# Patient Record
Sex: Female | Born: 1970 | Hispanic: Yes | Marital: Single | Smoking: Never smoker
Health system: Southern US, Community
[De-identification: ages and names within clinical notes are randomized; demographics above are authoritative.]

## PROBLEM LIST (undated history)

## (undated) HISTORY — PX: CHOLECYSTECTOMY: SHX55

---

## 2004-01-01 HISTORY — PX: CHOLECYSTECTOMY: SHX55

## 2004-04-11 ENCOUNTER — Ambulatory Visit (HOSPITAL_COMMUNITY): Admission: RE | Admit: 2004-04-11 | Discharge: 2004-04-11 | Payer: Self-pay | Admitting: General Surgery

## 2004-04-27 ENCOUNTER — Inpatient Hospital Stay (HOSPITAL_COMMUNITY): Admission: RE | Admit: 2004-04-27 | Discharge: 2004-04-28 | Payer: Self-pay | Admitting: General Surgery

## 2007-04-22 ENCOUNTER — Emergency Department (HOSPITAL_COMMUNITY): Admission: EM | Admit: 2007-04-22 | Discharge: 2007-04-22 | Payer: Self-pay | Admitting: Emergency Medicine

## 2007-04-24 ENCOUNTER — Ambulatory Visit (HOSPITAL_COMMUNITY): Admission: RE | Admit: 2007-04-24 | Discharge: 2007-04-24 | Payer: Self-pay | Admitting: Emergency Medicine

## 2010-06-26 ENCOUNTER — Emergency Department (HOSPITAL_COMMUNITY): Admission: EM | Admit: 2010-06-26 | Discharge: 2010-06-26 | Payer: Self-pay | Admitting: Emergency Medicine

## 2010-06-28 ENCOUNTER — Emergency Department (HOSPITAL_COMMUNITY): Admission: EM | Admit: 2010-06-28 | Discharge: 2010-06-29 | Payer: Self-pay | Admitting: Emergency Medicine

## 2010-11-17 ENCOUNTER — Emergency Department (HOSPITAL_COMMUNITY)
Admission: EM | Admit: 2010-11-17 | Discharge: 2010-11-17 | Payer: Self-pay | Source: Home / Self Care | Admitting: Emergency Medicine

## 2011-01-15 ENCOUNTER — Other Ambulatory Visit
Admission: RE | Admit: 2011-01-15 | Discharge: 2011-01-15 | Payer: Self-pay | Source: Home / Self Care | Admitting: Obstetrics and Gynecology

## 2011-01-21 ENCOUNTER — Encounter: Payer: Self-pay | Admitting: General Surgery

## 2011-03-13 LAB — CBC
HCT: 30.9 % — ABNORMAL LOW (ref 36.0–46.0)
Hemoglobin: 10.2 g/dL — ABNORMAL LOW (ref 12.0–15.0)
MCV: 79.9 fL (ref 78.0–100.0)
RBC: 3.87 MIL/uL (ref 3.87–5.11)
WBC: 9.1 10*3/uL (ref 4.0–10.5)

## 2011-03-13 LAB — COMPREHENSIVE METABOLIC PANEL
BUN: 5 mg/dL — ABNORMAL LOW (ref 6–23)
CO2: 22 mEq/L (ref 19–32)
Calcium: 9.1 mg/dL (ref 8.4–10.5)
Chloride: 104 mEq/L (ref 96–112)
Creatinine, Ser: 0.49 mg/dL (ref 0.4–1.2)
GFR calc non Af Amer: >60 mL/min (ref >60)
Total Bilirubin: 0.3 mg/dL (ref 0.3–1.2)

## 2011-03-13 LAB — HCG, QUANTITATIVE, PREGNANCY: hCG, Beta Chain, Quant, S: 96052 m[IU]/mL — ABNORMAL HIGH (ref <5)

## 2011-03-13 LAB — DIFFERENTIAL
Basophils Relative: 0 % (ref 0–1)
Lymphs Abs: 2.8 10*3/uL (ref 0.7–4.0)
Monocytes Relative: 7 % (ref 3–12)
Neutro Abs: 5.5 10*3/uL (ref 1.7–7.7)
Neutrophils Relative %: 61 % (ref 43–77)

## 2011-03-13 LAB — URINALYSIS, ROUTINE W REFLEX MICROSCOPIC
Bilirubin Urine: NEGATIVE
Ketones, ur: 15 mg/dL — AB
Nitrite: NEGATIVE
Protein, ur: NEGATIVE mg/dL
Urobilinogen, UA: 0.2 mg/dL (ref 0.0–1.0)

## 2011-03-13 LAB — URINE MICROSCOPIC-ADD ON

## 2011-03-13 LAB — POCT PREGNANCY, URINE: Preg Test, Ur: POSITIVE

## 2011-03-18 LAB — GC/CHLAMYDIA PROBE AMP, GENITAL
Chlamydia, DNA Probe: NEGATIVE
GC Probe Amp, Genital: NEGATIVE

## 2011-03-18 LAB — WET PREP, GENITAL
Trich, Wet Prep: NONE SEEN
Yeast Wet Prep HPF POC: NONE SEEN

## 2011-05-18 NOTE — Op Note (Signed)
NAME:  Bonnie Fleming, STIEFEL                 ACCOUNT NO.:  [id number]   MEDICAL RECORD NO.:  [id number]                   PATIENT TYPE:  AMB   LOCATION:  DAY                                  FACILITY:  [facility]   PHYSICIAN:  Barbaraann Barthel, M.D.              DATE OF BIRTH:  Oct 26, 1971   DATE OF PROCEDURE:  04/26/2004  DATE OF DISCHARGE:                                 OPERATIVE REPORT   SURGEON:  Barbaraann Barthel, M.D.   PREOPERATIVE DIAGNOSIS:  Cholecystitis, cholelithiasis.   POSTOPERATIVE DIAGNOSIS:  Cholecystitis, cholelithiasis.   PROCEDURE:  Laparoscopic cholecystectomy.   SPECIMENS:  Gallbladder and gallstones.   INDICATIONS FOR PROCEDURE:  This is a 40 year old [place] female who came to  my office with a history of right upper quadrant pain and nausea for quite  some time.  Her liver function studies were within normal limits.  Her  sonogram revealed multiple gallstones.  We discussed laparoscopic  cholecystectomy with this patient in detail in Spanish, and informed consent  was obtained.  We discussed the complications also in Spanish, not limited  to but including bleeding, infection, damage to bile ducts, perforation of  organs, and transitory diarrhea.  Informed consent was obtained.   GROSS OPERATIVE FINDINGS:  Those consistent with chronically inflamed-  appearing gallbladder with some adhesions, a small cystic duct which was not  cannulated, and multiple stones within the gallbladder.  The right upper  quadrant otherwise appeared normal.   TECHNIQUE:  The patient was placed in the supine position.  After the  adequate administration of general anesthesia via endotracheal intubation,  her entire abdomen was prepped with Betadine solution and draped in the  usual manner.  Following aseptic technique, a Foley catheter was placed.   A periumbilical incision was carried out over the superior aspect of the  umbilicus.  With the patient in Trendelenburg, the fascia  was grasped with a  sharp towel clip, and a Veress needle was inserted and confirmed to be in  position with a saline drop test.  Then using the Visiport technique, an 11-  mm cannula was placed in the umbilicus.  Then under direct vision, three  other cannulas were placed, one in the epigastrium and two in the right  upper quadrant laterally.  The gallbladder was grasped.  Its adhesions were  taken down.  The cystic duct was clearly visualized, triply silver clipped  and divided as was the cystic artery.  The gallbladder was then removed  uneventfully from the hepatic bed using the hook cautery device.  We then  removed the gallbladder using the endosac device through the epigastric  incision.  There was a little bleeding from the rectus muscles.  We had to  extend incision somewhat in order to allow the large stones to be removed,  as they were difficult to crush.  We had to manipulate this somewhat to get  through the epigastric incision.  This was not a problem, however.  We then  checked for hemostasis, irrigated, and then closed the fascia incisions with  0 Polysorb in the areas where the 11-mm cannula was placed in the  epigastrium and the umbilicus and then closed all skin incisions with the  stapling device.  Prior to closure, all sponge, needle, and instrument  counts were found to be correct.  Estimated blood loss was minimal.  No  drains were placed.  There were no complications.  The patient received 1 L  of crystalloid intraoperatively.   The patient was taken to the recovery room in satisfactory condition.      ___________________________________________                                            Barbaraann Barthel, M.D.   WB/MEDQ  D:  04/26/2004  T:  04/26/2004  Job:  [id number]

## 2011-05-18 NOTE — Discharge Summary (Signed)
NAME:  Bonnie Fleming, Bonnie Fleming                 ACCOUNT NO.:  [id number]   MEDICAL RECORD NO.:  [id number]                   PATIENT TYPE:  INP   LOCATION:  A306                                 FACILITY:  [facility]   PHYSICIAN:  Barbaraann Barthel, M.D.              DATE OF BIRTH:  1971/02/26   DATE OF ADMISSION:  04/26/2004  DATE OF DISCHARGE:  04/28/2004                                 DISCHARGE SUMMARY   DIAGNOSES:  1. Cholecystitis.  2. Cholelithiasis.   PROCEDURE:  On April 26, 2004, laparoscopic cholecystectomy.   COMPLICATIONS:  Urinary retention.   HISTORY OF PRESENT ILLNESS:  This is a 40 year old [place] female who  presented to my office with signs and symptoms of gallbladder disease.  Workup had shown her to have multiple stones within the gallbladder.  Her  liver function tests and amylase were grossly within normal limits.  She was  taken to surgery via the outpatient department and she did well  postoperatively after a laparoscopic cholecystectomy was performed.  However, she developed acute urinary retention requiring straight  catheterization, and then afterwards she was still complaining of dysuria  and unable to void.  We placed a Foley overnight, and then the following day  removed that Foley and she was able to void well without any problems.  We  started her on Bactrim and obtained urine cultures which are pending at  present.   Regarding her surgery, her wounds remained clean and with minimal incisional  discomfort, and she was tolerating full liquids well without any problem,  and ambulating without shortness of breath or leg pain.   LABORATORY DATA:  She was mildly anemic preoperatively with a hemoglobin of  10.9 and a hematocrit of 34.6.  At the time of her discharge hemoglobin and  hematocrit were 9.6 and 28.9.  This H&H was done on April 12, 2004.  Her  serum hCG was negative.  Her electrolytes were grossly within normal limits,  and her bilirubin was 0.3,  amylase was 48, SGOT and SGPT were normal at 15 a  piece, alkaline phosphatase was 85.   DISCHARGE INSTRUCTIONS:  We discharged her with the following instructions:  She is excused from work.  She is to be on a soft diet and liquids.  She is  told to increase her activities as tolerated.  She is permitted to shower.  She is told no driving, no heavy lifting or no sexual activities until so  instructed.  She is told to clean her wounds with alcohol t.i.d., take  Darvocet-N 100 one tablet q.4h. for pain.  She is to avoid taking any  aspirin, and we placed her on Bactrim DS one tablet q.12h. x10 days.  She  has been instructed as to when her next dose is, and her follow up  appointment with me.  She has also been told should she have any acute  problems to come to the emergency room.    ___________________________________________  Barbaraann Barthel, M.D.   WB/MEDQ  D:  04/28/2004  T:  04/28/2004  Job:  [id number]

## 2011-06-14 ENCOUNTER — Inpatient Hospital Stay (HOSPITAL_COMMUNITY)
Admission: AD | Admit: 2011-06-14 | Discharge: 2011-06-16 | DRG: 775 | Disposition: A | Discharge status: Home / Self Care | Payer: Medicaid Other | Source: Ambulatory Visit | Attending: Obstetrics & Gynecology | Admitting: Obstetrics & Gynecology

## 2011-06-14 DIAGNOSIS — O09529 Supervision of elderly multigravida, unspecified trimester: Secondary | ICD-10-CM

## 2011-06-14 LAB — CBC
HCT: 39.9 % (ref 36.0–46.0)
Hemoglobin: 13.8 g/dL (ref 12.0–15.0)
MCH: 32.4 pg (ref 26.0–34.0)
MCHC: 34.6 g/dL (ref 30.0–36.0)
RDW: 14.4 % (ref 11.5–15.5)

## 2011-12-11 ENCOUNTER — Other Ambulatory Visit: Payer: Self-pay | Admitting: Nurse Practitioner

## 2011-12-11 ENCOUNTER — Other Ambulatory Visit (HOSPITAL_COMMUNITY)
Admission: RE | Admit: 2011-12-11 | Discharge: 2011-12-11 | Disposition: A | Discharge status: Home / Self Care | Payer: Medicaid Other | Source: Ambulatory Visit | Attending: Family Medicine | Admitting: Family Medicine

## 2011-12-11 ENCOUNTER — Other Ambulatory Visit (HOSPITAL_COMMUNITY)
Admission: RE | Admit: 2011-12-11 | Discharge: 2011-12-11 | Disposition: A | Discharge status: Home / Self Care | Payer: Self-pay | Source: Ambulatory Visit | Attending: Unknown Physician Specialty | Admitting: Unknown Physician Specialty

## 2011-12-11 DIAGNOSIS — R87611 Atypical squamous cells cannot exclude high grade squamous intraepithelial lesion on cytologic smear of cervix (ASC-H): Secondary | ICD-10-CM | POA: Insufficient documentation

## 2011-12-11 DIAGNOSIS — R87619 Unspecified abnormal cytological findings in specimens from cervix uteri: Secondary | ICD-10-CM | POA: Insufficient documentation

## 2012-06-23 ENCOUNTER — Emergency Department (HOSPITAL_COMMUNITY)
Admission: EM | Admit: 2012-06-23 | Discharge: 2012-06-23 | Disposition: A | Discharge status: Home / Self Care | Payer: Self-pay | Attending: Emergency Medicine | Admitting: Emergency Medicine

## 2012-06-23 ENCOUNTER — Encounter (HOSPITAL_COMMUNITY): Payer: Self-pay | Admitting: *Deleted

## 2012-06-23 DIAGNOSIS — X19XXXA Contact with other heat and hot substances, initial encounter: Secondary | ICD-10-CM | POA: Insufficient documentation

## 2012-06-23 DIAGNOSIS — T22212A Burn of second degree of left forearm, initial encounter: Secondary | ICD-10-CM

## 2012-06-23 DIAGNOSIS — T22219A Burn of second degree of unspecified forearm, initial encounter: Secondary | ICD-10-CM | POA: Insufficient documentation

## 2012-06-23 DIAGNOSIS — L089 Local infection of the skin and subcutaneous tissue, unspecified: Secondary | ICD-10-CM | POA: Insufficient documentation

## 2012-06-23 MED ORDER — LIDOCAINE HCL (PF) 1 % IJ SOLN
INTRAMUSCULAR | Status: AC
  Administered 2012-06-23: 18:00:00
  Filled 2012-06-23: qty 5

## 2012-06-23 MED ORDER — DOXYCYCLINE MONOHYDRATE 100 MG PO TABS: 100.0000 mg | ORAL_TABLET | Freq: Two times a day (BID) | ORAL | Status: AC

## 2012-06-23 MED ORDER — OXYCODONE-ACETAMINOPHEN 5-325 MG PO TABS
1.0000 | ORAL_TABLET | Freq: Once | ORAL | Status: AC
  Administered 2012-06-23: 1 via ORAL
  Filled 2012-06-23: qty 1

## 2012-06-23 MED ORDER — CEFTRIAXONE SODIUM 1 G IJ SOLR
1.0000 g | Freq: Once | INTRAMUSCULAR | Status: AC
  Administered 2012-06-23: 1 g via INTRAMUSCULAR
  Filled 2012-06-23: qty 10

## 2012-06-23 MED ORDER — PERCOCET 5-325 MG PO TABS: 1.0000 | ORAL_TABLET | Freq: Four times a day (QID) | ORAL | Status: AC | PRN

## 2012-06-23 MED ORDER — DOXYCYCLINE HYCLATE 100 MG PO TABS
100.0000 mg | ORAL_TABLET | Freq: Two times a day (BID) | ORAL | Status: DC
  Administered 2012-06-23: 100 mg via ORAL
  Filled 2012-06-23: qty 1

## 2012-06-23 MED ORDER — DEXTROSE 5 % IV SOLN: 1.0000 g | Freq: Once | INTRAVENOUS | Status: DC

## 2012-06-23 MED ORDER — SILVER SULFADIAZINE 1 % EX CREA
TOPICAL_CREAM | Freq: Once | CUTANEOUS | Status: AC
  Administered 2012-06-23: 18:00:00 via TOPICAL
  Filled 2012-06-23: qty 50
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
## 2012-06-24 ENCOUNTER — Encounter (HOSPITAL_COMMUNITY): Payer: Self-pay | Admitting: *Deleted

## 2012-06-24 ENCOUNTER — Emergency Department (HOSPITAL_COMMUNITY)
Admission: EM | Admit: 2012-06-24 | Discharge: 2012-06-24 | Disposition: A | Discharge status: Home / Self Care | Payer: Self-pay | Attending: Emergency Medicine | Admitting: Emergency Medicine

## 2012-06-24 DIAGNOSIS — Z09 Encounter for follow-up examination after completed treatment for conditions other than malignant neoplasm: Secondary | ICD-10-CM

## 2012-06-24 DIAGNOSIS — Z4801 Encounter for change or removal of surgical wound dressing: Secondary | ICD-10-CM | POA: Insufficient documentation

## 2012-06-24 MED ORDER — OXYCODONE-ACETAMINOPHEN 5-325 MG PO TABS: 1.0000 | ORAL_TABLET | ORAL | Status: AC | PRN

## 2012-06-24 MED ORDER — OXYCODONE-ACETAMINOPHEN 5-325 MG PO TABS
1.0000 | ORAL_TABLET | Freq: Once | ORAL | Status: AC
  Administered 2012-06-24: 1 via ORAL
  Filled 2012-06-24: qty 1

## 2012-06-24 MED ORDER — DOXYCYCLINE HYCLATE 100 MG PO TABS
100.0000 mg | ORAL_TABLET | Freq: Once | ORAL | Status: AC
  Administered 2012-06-24: 100 mg via ORAL
  Filled 2012-06-24: qty 1
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
## 2012-06-24 NOTE — ED Provider Notes (Signed)
Medical screening examination/treatment/procedure(s) were performed by non-physician practitioner and as supervising physician I was immediately available for consultation/collaboration.  Koty Anctil, MD 06/24/12 1848 

## 2012-06-24 NOTE — Discharge Instructions (Signed)
It is very important that you gently wash your wound with mild soap and water twice daily and then reapply a thin layer of Silvadene antibiotic cream you were given yesterday.  It is also very important that you start taking the doxycycline that you were prescribed yesterday, make sure you get this medication filled tomorrow morning.  You were given your evening dose here tonight.  You may use the pain medication that you were given here as needed, one or 2 tablets every 6 hours for pain relief, however this medication will make you drowsy so do not drive within 4 hours of taking it.  Call Dr. Dian Situ as discussed as he needs to follow you as this infected wound continues to heal.  Please call his office in the morning for recheck of your injury within the next 2 days.  If you are unable to see him, please return here for recheck of your wound.

## 2012-06-30 MED FILL — Oxycodone w/ Acetaminophen Tab 5-325 MG: ORAL | Qty: 6 | Status: AC

## 2014-06-16 ENCOUNTER — Other Ambulatory Visit (HOSPITAL_COMMUNITY): Payer: Self-pay | Admitting: *Deleted

## 2014-06-16 DIAGNOSIS — Z1231 Encounter for screening mammogram for malignant neoplasm of breast: Secondary | ICD-10-CM

## 2014-06-24 ENCOUNTER — Ambulatory Visit (HOSPITAL_COMMUNITY): Payer: Medicaid Other

## 2014-06-29 ENCOUNTER — Ambulatory Visit (HOSPITAL_COMMUNITY)
Admission: RE | Admit: 2014-06-29 | Discharge: 2014-06-29 | Disposition: A | Discharge status: Home / Self Care | Payer: Medicaid Other | Source: Ambulatory Visit | Attending: *Deleted | Admitting: *Deleted

## 2014-06-29 DIAGNOSIS — Z1231 Encounter for screening mammogram for malignant neoplasm of breast: Secondary | ICD-10-CM

## 2015-10-26 ENCOUNTER — Other Ambulatory Visit (HOSPITAL_COMMUNITY): Payer: Self-pay | Admitting: Nurse Practitioner

## 2015-10-26 DIAGNOSIS — Z1231 Encounter for screening mammogram for malignant neoplasm of breast: Secondary | ICD-10-CM

## 2015-11-02 ENCOUNTER — Ambulatory Visit (HOSPITAL_COMMUNITY)
Admission: RE | Admit: 2015-11-02 | Discharge: 2015-11-02 | Disposition: A | Discharge status: Home / Self Care | Payer: Medicaid Other | Source: Ambulatory Visit | Attending: Nurse Practitioner | Admitting: Nurse Practitioner

## 2015-11-02 DIAGNOSIS — Z1231 Encounter for screening mammogram for malignant neoplasm of breast: Secondary | ICD-10-CM

## 2016-05-15 ENCOUNTER — Encounter (HOSPITAL_COMMUNITY): Payer: Self-pay

## 2016-05-15 ENCOUNTER — Emergency Department (HOSPITAL_COMMUNITY)
Admission: EM | Admit: 2016-05-15 | Discharge: 2016-05-15 | Disposition: A | Discharge status: Home / Self Care | Payer: Self-pay | Attending: Emergency Medicine | Admitting: Emergency Medicine

## 2016-05-15 ENCOUNTER — Emergency Department (HOSPITAL_COMMUNITY): Payer: Self-pay

## 2016-05-15 DIAGNOSIS — R1013 Epigastric pain: Secondary | ICD-10-CM | POA: Insufficient documentation

## 2016-05-15 DIAGNOSIS — Z79899 Other long term (current) drug therapy: Secondary | ICD-10-CM | POA: Insufficient documentation

## 2016-05-15 DIAGNOSIS — Z9049 Acquired absence of other specified parts of digestive tract: Secondary | ICD-10-CM | POA: Insufficient documentation

## 2016-05-15 DIAGNOSIS — R109 Unspecified abdominal pain: Secondary | ICD-10-CM

## 2016-05-15 LAB — URINALYSIS, ROUTINE W REFLEX MICROSCOPIC
BILIRUBIN URINE: NEGATIVE
Glucose, UA: NEGATIVE mg/dL
Hgb urine dipstick: NEGATIVE
Ketones, ur: NEGATIVE mg/dL
LEUKOCYTES UA: NEGATIVE
NITRITE: NEGATIVE
PROTEIN: NEGATIVE mg/dL
SPECIFIC GRAVITY, URINE: 1.015 (ref 1.005–1.030)
pH: 6.5 (ref 5.0–8.0)

## 2016-05-15 LAB — CBC WITH DIFFERENTIAL/PLATELET
Basophils Absolute: 0 10*3/uL (ref 0.0–0.1)
Basophils Relative: 0 %
EOS ABS: 0 10*3/uL (ref 0.0–0.7)
EOS PCT: 0 %
HCT: 38.8 % (ref 36.0–46.0)
Hemoglobin: 13.3 g/dL (ref 12.0–15.0)
LYMPHS ABS: 1.6 10*3/uL (ref 0.7–4.0)
LYMPHS PCT: 14 %
MCH: 31.4 pg (ref 26.0–34.0)
MCHC: 34.3 g/dL (ref 30.0–36.0)
MCV: 91.7 fL (ref 78.0–100.0)
MONO ABS: 0.6 10*3/uL (ref 0.1–1.0)
MONOS PCT: 5 %
Neutro Abs: 8.9 10*3/uL — ABNORMAL HIGH (ref 1.7–7.7)
Neutrophils Relative %: 81 %
PLATELETS: 266 10*3/uL (ref 150–400)
RBC: 4.23 MIL/uL (ref 3.87–5.11)
RDW: 12.8 % (ref 11.5–15.5)
WBC: 11 10*3/uL — AB (ref 4.0–10.5)

## 2016-05-15 LAB — COMPREHENSIVE METABOLIC PANEL
ALT: 77 U/L — AB (ref 14–54)
ANION GAP: 7 (ref 5–15)
AST: 106 U/L — ABNORMAL HIGH (ref 15–41)
Albumin: 4.7 g/dL (ref 3.5–5.0)
Alkaline Phosphatase: 91 U/L (ref 38–126)
BUN: 15 mg/dL (ref 6–20)
CHLORIDE: 102 mmol/L (ref 101–111)
CO2: 26 mmol/L (ref 22–32)
CREATININE: 0.41 mg/dL — AB (ref 0.44–1.00)
Calcium: 9.3 mg/dL (ref 8.9–10.3)
Glucose, Bld: 112 mg/dL — ABNORMAL HIGH (ref 65–99)
POTASSIUM: 3.8 mmol/L (ref 3.5–5.1)
SODIUM: 135 mmol/L (ref 135–145)
Total Bilirubin: 0.8 mg/dL (ref 0.3–1.2)
Total Protein: 8 g/dL (ref 6.5–8.1)

## 2016-05-15 LAB — PREGNANCY, URINE: PREG TEST UR: NEGATIVE

## 2016-05-15 LAB — LIPASE, BLOOD: LIPASE: 28 U/L (ref 11–51)

## 2016-05-15 MED ORDER — IOPAMIDOL (ISOVUE-300) INJECTION 61%
100.0000 mL | Freq: Once | INTRAVENOUS | Status: AC | PRN
  Administered 2016-05-15: 100 mL via INTRAVENOUS

## 2016-05-15 MED ORDER — TRAMADOL HCL 50 MG PO TABS: 50.0000 mg | ORAL_TABLET | Freq: Four times a day (QID) | ORAL | Status: DC | PRN

## 2016-05-15 MED ORDER — PANTOPRAZOLE SODIUM 40 MG IV SOLR
40.0000 mg | Freq: Once | INTRAVENOUS | Status: AC
  Administered 2016-05-15: 40 mg via INTRAVENOUS
  Filled 2016-05-15: qty 40

## 2016-05-15 MED ORDER — ONDANSETRON HCL 4 MG/2ML IJ SOLN
4.0000 mg | Freq: Once | INTRAMUSCULAR | Status: AC
  Administered 2016-05-15: 4 mg via INTRAVENOUS
  Filled 2016-05-15: qty 2

## 2016-05-15 MED ORDER — PROMETHAZINE HCL 25 MG PO TABS: 25.0000 mg | ORAL_TABLET | Freq: Four times a day (QID) | ORAL | Status: DC | PRN

## 2016-05-15 MED ORDER — RANITIDINE HCL 150 MG PO TABS: 150.0000 mg | ORAL_TABLET | Freq: Two times a day (BID) | ORAL | Status: DC

## 2016-05-15 MED ORDER — HYDROMORPHONE HCL 1 MG/ML IJ SOLN
1.0000 mg | Freq: Once | INTRAMUSCULAR | Status: AC
  Administered 2016-05-15: 1 mg via INTRAVENOUS
  Filled 2016-05-15: qty 1

## 2016-05-15 NOTE — ED Provider Notes (Signed)
CSN: [id number]     Arrival date & time 05/15/16  1638 History   First MD Initiated Contact with Patient 05/15/16 1928     Chief Complaint  Patient presents with  . Abdominal Pain     (Consider location/radiation/quality/duration/timing/severity/associated sxs/prior Treatment) Patient is a 45 y.o. female presenting with abdominal pain. The history is provided by the patient (The patient complains of epigastric abdominal pain with some nausea).  Abdominal Pain Pain location:  Epigastric Pain quality: aching   Pain radiates to:  Does not radiate Pain severity:  Moderate Onset quality:  Sudden Timing:  Constant Progression:  Waxing and waning Chronicity:  New Associated symptoms: no chest pain, no cough, no diarrhea, no fatigue and no hematuria     History reviewed. No pertinent past medical history. Past Surgical History  Procedure Laterality Date  . Cholecystectomy     No family history on file. Social History  Substance Use Topics  . Smoking status: Never Smoker   . Smokeless tobacco: None  . Alcohol Use: No   OB History    No data available     Review of Systems  Constitutional: Negative for appetite change and fatigue.  HENT: Negative for congestion, ear discharge and sinus pressure.   Eyes: Negative for discharge.  Respiratory: Negative for cough.   Cardiovascular: Negative for chest pain.  Gastrointestinal: Positive for abdominal pain. Negative for diarrhea.  Genitourinary: Negative for frequency and hematuria.  Musculoskeletal: Negative for back pain.  Skin: Negative for rash.  Neurological: Negative for seizures and headaches.  Psychiatric/Behavioral: Negative for hallucinations.      Allergies  Review of patient's allergies indicates no known allergies.  Home Medications   Prior to Admission medications   Medication Sig Start Date End Date Taking? Authorizing Provider  levonorgestrel (MIRENA) 20 MCG/24HR IUD 1 each by Intrauterine route once.   Yes  Historical Provider, MD  Naproxen Sodium (ALEVE) 220 MG CAPS Take 440 mg by mouth as needed. For pain   Yes Historical Provider, MD  promethazine (PHENERGAN) 25 MG tablet Take 1 tablet (25 mg total) by mouth every 6 (six) hours as needed for nausea or vomiting. 05/15/16   Milton Ferguson, MD  ranitidine (ZANTAC) 150 MG tablet Take 1 tablet (150 mg total) by mouth 2 (two) times daily. 05/15/16   Milton Ferguson, MD  traMADol (ULTRAM) 50 MG tablet Take 1 tablet (50 mg total) by mouth every 6 (six) hours as needed. 05/15/16   Milton Ferguson, MD   BP 94/55 mmHg  Pulse 69  Temp(Src) 98.7 F (37.1 C) (Oral)  Resp 19  Ht 5\' 1"  (1.549 m)  Wt 140 lb (63.504 kg)  BMI 26.47 kg/m2  SpO2 100%  LMP 05/08/2016 Physical Exam  Constitutional: She is oriented to person, place, and time. She appears well-developed.  HENT:  Head: Normocephalic.  Eyes: Conjunctivae and EOM are normal. No scleral icterus.  Neck: Neck supple. No thyromegaly present.  Cardiovascular: Normal rate and regular rhythm.  Exam reveals no gallop and no friction rub.   No murmur heard. Pulmonary/Chest: No stridor. She has no wheezes. She has no rales. She exhibits no tenderness.  Abdominal: She exhibits no distension. There is tenderness. There is no rebound.  Musculoskeletal: Normal range of motion. She exhibits no edema.  Lymphadenopathy:    She has no cervical adenopathy.  Neurological: She is oriented to person, place, and time. She exhibits normal muscle tone. Coordination normal.  Skin: No rash noted. No erythema.  Psychiatric: She has  a normal mood and affect. Her behavior is normal.    ED Course  Procedures (including critical care time) Labs Review Labs Reviewed  CBC WITH DIFFERENTIAL/PLATELET - Abnormal; Notable for the following:    WBC 11.0 (*)    Neutro Abs 8.9 (*)    All other components within normal limits  COMPREHENSIVE METABOLIC PANEL - Abnormal; Notable for the following:    Glucose, Bld 112 (*)    Creatinine,  Ser 0.41 (*)    AST 106 (*)    ALT 77 (*)    All other components within normal limits  URINALYSIS, ROUTINE W REFLEX MICROSCOPIC (NOT AT [facility])  PREGNANCY, URINE  LIPASE, BLOOD    Imaging Review Ct Abdomen Pelvis W Contrast  05/15/2016  CLINICAL DATA:  Pain of umbilical region and nausea today. EXAM: CT ABDOMEN AND PELVIS WITH CONTRAST TECHNIQUE: Multidetector CT imaging of the abdomen and pelvis was performed using the standard protocol following bolus administration of intravenous contrast. CONTRAST:  180mL ISOVUE-300 IOPAMIDOL (ISOVUE-300) INJECTION 61% COMPARISON:  None. FINDINGS: Lower chest: No acute findings. Minimal dependent atelectasis of the lung bases are noted. Hepatobiliary: The liver is normal. The patient is status post prior cholecystectomy. There is postsurgical intra and extrahepatic biliary ductal dilatation. The common bile duct measures 1.2 cm Pancreas: No mass, inflammatory changes, or other significant abnormality. Spleen: Within normal limits in size and appearance. Adrenals/Urinary Tract: The adrenal glands are normal. There is a 3 mm cyst in the midpole left kidney. The right kidney is normal. There is no hydronephrosis bilaterally. The bladder is normal. Stomach/Bowel: No evidence of obstruction, inflammatory process, or abnormal fluid collections. There is no diverticulitis. The appendix is normal. Vascular/Lymphatic: No pathologically enlarged lymph nodes. No evidence of abdominal aortic aneurysm. Reproductive: IUD is identified in the uterus. There is a 5.1 x 4.9 cm cyst in the right ovary. The left ovary normal. Other: None. Musculoskeletal:  No suspicious bone lesions identified. IMPRESSION: There is no bowel obstruction or diverticulitis. The appendix is normal. 5.1 cm cyst in the right ovary, likely a follicular cyst. IUD in the uterus. Prior cholecystectomy with postsurgical dilatation of intra and extrahepatic biliary ducts. Electronically Signed   By: Abelardo Diesel  M.D.   On: 05/15/2016 20:55   I have personally reviewed and evaluated these images and lab results as part of my medical decision-making.   EKG Interpretation None      MDM   Final diagnoses:  Abdominal pain in female    Patient with epigastric pain. CT scan unremarkable. Labs do show mild elevation of liver studies. Patient has had a cholecystectomy.. Patient will be sent home with Zantac Ultram and Zofran and referred to GI    Milton Ferguson, MD 05/15/16 2218

## 2016-05-15 NOTE — Discharge Instructions (Signed)
Follow up with dr. Gala Romney in 2 weeks for recheck

## 2016-05-15 NOTE — ED Notes (Signed)
Pt c/o pain in umbillical region and nausea since noon today.  Denies any vomiting or diarrhea.  LBM was 1 hour ago and was normal per pt.

## 2016-10-31 ENCOUNTER — Other Ambulatory Visit (HOSPITAL_COMMUNITY): Payer: Self-pay | Admitting: Nurse Practitioner

## 2016-10-31 DIAGNOSIS — Z1231 Encounter for screening mammogram for malignant neoplasm of breast: Secondary | ICD-10-CM

## 2016-11-14 ENCOUNTER — Ambulatory Visit (HOSPITAL_COMMUNITY)
Admission: RE | Admit: 2016-11-14 | Discharge: 2016-11-14 | Disposition: A | Discharge status: Home / Self Care | Payer: Medicaid Other | Source: Ambulatory Visit | Attending: Nurse Practitioner | Admitting: Nurse Practitioner

## 2016-11-14 DIAGNOSIS — Z1231 Encounter for screening mammogram for malignant neoplasm of breast: Secondary | ICD-10-CM

## 2018-02-05 ENCOUNTER — Other Ambulatory Visit: Payer: Self-pay | Admitting: Nurse Practitioner

## 2018-02-11 ENCOUNTER — Other Ambulatory Visit: Payer: Self-pay | Admitting: Nurse Practitioner

## 2018-02-11 DIAGNOSIS — Z1231 Encounter for screening mammogram for malignant neoplasm of breast: Secondary | ICD-10-CM

## 2018-03-19 ENCOUNTER — Ambulatory Visit
Admission: RE | Admit: 2018-03-19 | Discharge: 2018-03-19 | Disposition: A | Discharge status: Home / Self Care | Payer: No Typology Code available for payment source | Source: Ambulatory Visit | Attending: Nurse Practitioner | Admitting: Nurse Practitioner

## 2018-03-19 DIAGNOSIS — Z1231 Encounter for screening mammogram for malignant neoplasm of breast: Secondary | ICD-10-CM

## 2020-02-04 ENCOUNTER — Ambulatory Visit: Payer: No Typology Code available for payment source | Attending: Internal Medicine

## 2020-02-04 ENCOUNTER — Other Ambulatory Visit: Payer: Self-pay

## 2020-02-04 DIAGNOSIS — Z20822 Contact with and (suspected) exposure to covid-19: Secondary | ICD-10-CM | POA: Insufficient documentation

## 2020-02-05 LAB — NOVEL CORONAVIRUS, NAA: SARS-CoV-2, NAA: NOT DETECTED

## 2020-02-09 ENCOUNTER — Telehealth: Payer: Self-pay | Admitting: General Practice

## 2020-02-09 NOTE — Telephone Encounter (Signed)
Negative COVID results given. Patient results "NOT Detected." Caller expressed understanding. ° °

## 2020-02-15 ENCOUNTER — Other Ambulatory Visit: Payer: Self-pay | Admitting: Nurse Practitioner

## 2020-02-15 DIAGNOSIS — Z30431 Encounter for routine checking of intrauterine contraceptive device: Secondary | ICD-10-CM

## 2020-02-15 DIAGNOSIS — N852 Hypertrophy of uterus: Secondary | ICD-10-CM

## 2020-02-19 ENCOUNTER — Ambulatory Visit (HOSPITAL_COMMUNITY)
Admission: RE | Admit: 2020-02-19 | Discharge: 2020-02-19 | Disposition: A | Discharge status: Home / Self Care | Payer: Self-pay | Source: Ambulatory Visit | Attending: Nurse Practitioner | Admitting: Nurse Practitioner

## 2020-02-19 ENCOUNTER — Other Ambulatory Visit: Payer: Self-pay

## 2020-02-19 DIAGNOSIS — N852 Hypertrophy of uterus: Secondary | ICD-10-CM | POA: Insufficient documentation

## 2020-02-19 DIAGNOSIS — Z30431 Encounter for routine checking of intrauterine contraceptive device: Secondary | ICD-10-CM | POA: Insufficient documentation

## 2020-03-02 ENCOUNTER — Other Ambulatory Visit (HOSPITAL_COMMUNITY): Payer: Self-pay | Admitting: Nurse Practitioner

## 2020-03-02 DIAGNOSIS — N83201 Unspecified ovarian cyst, right side: Secondary | ICD-10-CM

## 2020-04-08 ENCOUNTER — Ambulatory Visit (HOSPITAL_COMMUNITY)
Admission: RE | Admit: 2020-04-08 | Discharge: 2020-04-08 | Disposition: A | Discharge status: Home / Self Care | Payer: Self-pay | Source: Ambulatory Visit | Attending: Nurse Practitioner | Admitting: Nurse Practitioner

## 2020-04-08 ENCOUNTER — Other Ambulatory Visit: Payer: Self-pay

## 2020-04-08 DIAGNOSIS — N83201 Unspecified ovarian cyst, right side: Secondary | ICD-10-CM | POA: Insufficient documentation

## 2020-04-22 ENCOUNTER — Other Ambulatory Visit: Payer: Self-pay

## 2020-04-22 ENCOUNTER — Ambulatory Visit (INDEPENDENT_AMBULATORY_CARE_PROVIDER_SITE_OTHER): Payer: Self-pay | Admitting: Obstetrics & Gynecology

## 2020-04-22 ENCOUNTER — Encounter: Payer: Self-pay | Admitting: Obstetrics & Gynecology

## 2020-04-22 VITALS — BP 111/73 | HR 70 | Ht 60.0 in | Wt 140.0 lb

## 2020-04-22 DIAGNOSIS — N80129 Deep endometriosis of ovary, unspecified ovary: Secondary | ICD-10-CM

## 2020-04-22 DIAGNOSIS — N801 Endometriosis of ovary: Secondary | ICD-10-CM

## 2020-04-22 NOTE — Progress Notes (Signed)
Follow up appointment for results  Chief Complaint  Patient presents with  . Ovarian Cyst    Blood pressure 111/73, pulse 70, height 5' (1.524 m), weight 140 lb (63.5 kg).  Study Result  CLINICAL DATA:  RIGHT ovarian cyst  EXAM: TRANSABDOMINAL AND TRANSVAGINAL ULTRASOUND OF PELVIS  TECHNIQUE: Both transabdominal and transvaginal ultrasound examinations of the pelvis were performed. Transabdominal technique was performed for global imaging of the pelvis including uterus, ovaries, adnexal regions, and pelvic cul-de-sac. It was necessary to proceed with endovaginal exam following the transabdominal exam to visualize the endometrium and LEFT ovary, and to characterize a RIGHT adnexal cystic lesion.  COMPARISON:  02/19/2020  FINDINGS: Uterus  Measurements: 8.3 x 5.8 x 6.4 cm = volume: 160 mL. Retroverted and mildly retroflexed. Diffusely inhomogeneous myometrial echogenicity with ill-defined endometrial complex, raising question of adenomyosis. Small hypoechoic foci are identified subserosal, could represent more heterogeneous areas of adenomyosis or small leiomyomata each measuring up to 17 mm diameter.  Endometrium  Thickness: 8 mm.  No endometrial fluid or focal abnormality  Right ovary  Measurements: 9.1 x 7.9 x 8.4 cm = volume: 316 mL. Predominantly replaced by a large cyst 7.7 x 7.9 x 7.9 cm (volume = 250 cm^3) previously 8.6 x 6.2 x 8.5 cm (volume = 240 cm^3), containing scattered low level internal echogenicity. No septations or mural nodularity. No internal vascularity on color Doppler imaging.  Left ovary  Measurements: 2.7 x 1.4 x 1.5 cm = volume: 2.8 mL. Normal morphology without mass  Other findings  No free pelvic fluid or additional adnexal masses.  IMPRESSION: Suspect adenomyosis of the uterus, cannot exclude additional 2 small subserosal leiomyomata.  Large mildly complicated cyst of the RIGHT ovary versus endometrioma 7.7 x  7.9 x 7.9 cm, little changed in volume since the previous exam.  This lesion has been present since CT ex reflex she [facility] EM chills bike am from 05/15/2016, then 5.4 cm maximal size.  As this has been generally stable over 6 weeks, either continued follow-up by ultrasound in 3-6 months to demonstrate stability or characterization by MR recommended.       MEDS ordered this encounter: No orders of the defined types were placed in this encounter.   Orders for this encounter: No orders of the defined types were placed in this encounter.   Impression:   ICD-10-CM   1. 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       Face to face time:  20 minutes  Greater than 50% of the visit time was spent in counseling and coordination of care with the patient.  The summary and outline of the counseling and care coordination is summarized in the note above.   All questions were answered.  History reviewed. No pertinent past medical history.  Past Surgical History:  Procedure Laterality Date  . CHOLECYSTECTOMY      OB History   No obstetric history on file.     No Known Allergies  Social History   Socioeconomic History  . Marital status: Divorced    Spouse name: Not on file  . Number of children: Not on file  . Years of education: Not on file  . Highest education level: Not on file  Occupational History  . Not on file  Tobacco Use  . Smoking status: Never Smoker  .  Smokeless tobacco: Never Used  Substance and Sexual Activity  . Alcohol use: No  . Drug use: No  . Sexual activity: Yes    Birth control/protection: Injection  Other Topics Concern  . Not on file  Social History Narrative  . Not on file   Social Determinants of Health   Financial Resource Strain: Medium Risk  . Difficulty of Paying Living Expenses: Somewhat  hard  Food Insecurity: No Food Insecurity  . Worried About Charity fundraiser in the Last Year: Never true  . Ran Out of Food in the Last Year: Never true  Transportation Needs: No Transportation Needs  . Lack of Transportation (Medical): No  . Lack of Transportation (Non-Medical): No  Physical Activity: Insufficiently Active  . Days of Exercise per Week: 1 day  . Minutes of Exercise per Session: 30 min  Stress: No Stress Concern Present  . Feeling of Stress : Not at all  Social Connections: Slightly Isolated  . Frequency of Communication with Friends and Family: Three times a week  . Frequency of Social Gatherings with Friends and Family: Never  . Attends Religious Services: More than 4 times per year  . Active Member of Clubs or Organizations: No  . Attends Archivist Meetings: Never  . Marital Status: Living with partner    Family History  Problem Relation Age of Onset  . Diabetes Father

## 2020-05-04 ENCOUNTER — Other Ambulatory Visit (HOSPITAL_COMMUNITY): Payer: Self-pay | Admitting: Nurse Practitioner

## 2020-05-04 DIAGNOSIS — Z1231 Encounter for screening mammogram for malignant neoplasm of breast: Secondary | ICD-10-CM

## 2020-05-11 ENCOUNTER — Other Ambulatory Visit: Payer: Self-pay | Admitting: Obstetrics & Gynecology

## 2020-05-11 NOTE — Patient Instructions (Signed)
Instrucciones Para Antes de la Ciruga   Su ciruga est programada para- 05/18/2020 @ 1000.   Bonnie Fleming    Por favor llame al [phone] si tiene algn problema en la maana de la ciruga.                   Recuerde   No coma alimentos ni tome lquidos, incluyendo agua, despus de la medianoche del 05/17/2020.    Tome estas medicinas en la maana de la ciruga con un SORBITO de agua - None   Puede cepillarse los dientes en la maana de la [place].    No use joyas, maquillaje de ojos, lpiz labial, crema para el cuerpo o esmalte de uas oscuro.    No puede usar desodorante.    Si va a ser ingresado despues de la ciruga, deje la [employer] en el carro hasta que se le haya asignado una habitacin.   A los pacientes que se les d de alta el mismo da no se les permitir manejar a casa.     Use ropa suelta y cmoda de regreso a casa.     Salpingooforectoma unilateral, cuidados posteriores Unilateral Salpingo-Oophorectomy, Care After Lea esta informacin sobre cmo cuidarse despus del procedimiento. Su mdico tambin podr darle indicaciones ms especficas. Comunquese con su mdico si tiene problemas o preguntas. Qu puedo esperar despus del procedimiento? Despus del procedimiento, es comn [employer] siguientes sntomas:  Dolor abdominal.  Algn sangrado vaginal ocasional (pequeas manchas).  Cansancio. Siga estas indicaciones en su casa: Cuidados de la incisin   Mantenga la zona de la incisin y el vendaje limpios y secos.  Siga las indicaciones del mdico acerca del cuidado de la incisin. Haga lo siguiente: ? Lvese las manos con agua y [place] antes de cambiar el vendaje. Use desinfectante para manos si no dispone de [place] y [place]. ? Cambie el vendaje como se lo haya indicado el mdico. ? No retire los puntos (suturas), las grapas, la goma para cerrar la piel o las tiras [place]. Es posible que  estos cierres cutneos Animal nutritionist en la piel durante 2semanas o ms tiempo. Si los bordes de las tiras adhesivas empiezan a despegarse y Therapist, sports, puede recortar los que estn sueltos. No retire las tiras [facility] por completo a menos que el mdico se lo indique.  [place] zona de la incisin para detectar signos de infeccin. Est atento a los siguientes signos: ? Dolor, hinchazn o enrojecimiento. ? Lquido o sangre. ? Calor. ? Pus o mal olor. Actividad  No conduzca ni use maquinaria pesada mientras toma analgsicos recetados.  No conduzca durante 24horas si le administraron un medicamento para ayudarlo a que se relaje (sedante).  Haga caminatas cortas y frecuentes Agricultural consultant. Descanse cuando se sienta cansada. Pregntele al mdico qu actividades son seguras para usted.  Evite actividades que requieran un gran esfuerzo. Tambin, evite levantar pesos [employer]. No levante ningn objeto que pese ms de 5libras (2,3kg) o el lmite de peso que le indique su mdico hasta que este le diga que puede [place].  No se haga duchas vaginales, no use tampones ni tenga relaciones sexuales hasta que su mdico lo autorice. Instrucciones generales  A fin de prevenir o tratar el estreimiento mientras toma analgsicos recetados, el mdico puede recomendarle lo siguiente: ? Beber suficiente lquido para mantener la orina de color amarillo plido. ? Tomar medicamentos recetados o de [employer]. ? Consumir alimentos ricos en fibra, como frutas y verduras frescas, cereales integrales y frijoles. ? Limitar el consumo de alimentos ricos en grasas y azcares procesados, como alimentos fritos o dulces.  Tome los medicamentos de venta libre y los recetados solamente como se lo haya indicado el mdico.  No tome baos de inmersin, no nade ni use el jacuzzi hasta que el mdico lo autorice. Pregntele al mdico si puede ducharse. [name] solo le permitan darse baos de  [place].  Use las medias de compresin como se lo haya indicado el mdico. Estas medias ayudan a Mining engineer formacin de cogulos de [name] y a reducir la hinchazn de las piernas.  Concurra a todas las visitas de control como se lo haya indicado el mdico. Esto es importante. Comunquese con un mdico si:  Siente dolor al [employer].  Tiene una secrecin con feo olor o pus que proviene de la vagina.  Tiene enrojecimiento, hinchazn o dolor alrededor de la incisin.  Le sale lquido o sangre de la incisin.  La incisin est caliente al tacto.  Tiene pus o percibe que sale mal olor del lugar de la incisin.  Tiene fiebre.  La incisin comienza a abrirse.  Tiene un dolor abdominal que empeora o que no mejora con los medicamentos.  Le aparece una erupcin cutnea.  Presenta nuseas o vmitos.  Se siente mareado. Solicite ayuda de inmediato si:  Tree surgeon en el pecho o en la pierna.  Le falta el aire.  Se desmaya.  Observa un aumento de la hemorragia vaginal. Resumen  Despus del procedimiento, es comn Patent attorney, cansancio y sangrado ocasional de la vagina.  Siga las indicaciones del mdico acerca del cuidado de la incisin.  Controle la incisin todos los das para detectar signos de infeccin e informe sobre cualquier sntoma a su mdico.  Siga las indicaciones del mdico respecto de las actividades y Futures trader. Esta informacin no tiene Marine scientist el consejo del mdico. Asegrese de hacerle al mdico cualquier pregunta que tenga. Document Revised: 07/23/2017 Document Reviewed: 07/23/2017 [name] Patient Education  [place] general en adultos, cuidados posteriores General Anesthesia, Adult, Care After Lea esta informacin sobre cmo cuidarse despus del procedimiento. El mdico tambin podr darle instrucciones ms especficas. Comunquese con su mdico si tiene problemas o preguntas. Qu puedo esperar despus del  procedimiento? Luego del procedimiento, son comunes los siguiente efectos secundarios:  Dolor o Scientist, research (life sciences) en el lugar de la va intravenosa (i.v.).  Nuseas.  Vmitos.  Dolor de Investment banker, operational.  Dificultad para concentrarse.  Sentir fro o [employer].  Debilidad o cansancio.  Somnolencia y Programmer, applications.  Malestar y Hydrologist. Estos efectos secundarios pueden afectar partes del cuerpo que no estuvieron involucradas en la ciruga. Siga estas indicaciones en su casa:  Durante al menos 24horas despus del procedimiento:  Pdale a un adulto responsable que permanezca con usted. Es importante que alguien cuide de usted hasta que se despierte y Cabin crew.  Descanse todo lo que sea necesario.  No haga lo siguiente: ? Participar en actividades en las que podra caerse o lastimarse. ? Conducir. ? Operar maquinarias pesadas. ? Beber alcohol. ? Tomar somnferos o medicamentos que causen somnolencia. ? Firmar documentos legales ni tomar [employer]. ? Cuidar a nios  por su cuenta. Qu debe comer y beber  Siga las indicaciones del mdico respecto de las restricciones de comidas o bebidas.  Cuando [employer], comience a comer cantidades pequeas de alimentos que sean blandos y fciles de Publishing copy (livianos), como una tostada. Retome su dieta habitual de forma gradual.  Beba suficiente lquido como para mantener la orina de color amarillo plido.  Si vomita, rehidrtese tomando agua, jugo o caldo transparente. Instrucciones generales  Si tiene apnea del sueo, la [place] y ciertos medicamentos pueden aumentar el riesgo de problemas respiratorios. Siga las indicaciones del mdico respecto al uso de su dispositivo para dormir: ? Siempre que duerma, incluso durante las siestas que tome en el da. ? Mientras tome analgsicos recetados, medicamentos para dormir o medicamentos que producen somnolencia.  Reanude sus actividades normales segn lo indicado por el mdico.  Pregntele al mdico qu actividades son seguras para usted.  Tome los medicamentos de venta libre y los recetados solamente como se lo haya indicado el mdico.  Si fuma, no lo haga sin supervisin.  Concurra a todas las visitas de seguimiento como se lo haya indicado el mdico. Esto es importante. Comunquese con un mdico si:  Tiene nuseas o vmitos que no mejoran con medicamentos.  No puede comer ni beber sin vomitar.  El dolor no se alivia con medicamentos.  No puede orinar.  Tiene una erupcin cutnea.  Tiene fiebre.  Presenta enrojecimiento alrededor del lugar de la va intravenosa (i.v.) que empeora. Solicite ayuda de inmediato si:  Tiene dificultad para respirar.  Siente dolor en el pecho.  Observa sangre en la orina o heces, o vomita sangre. Resumen  Despus del procedimiento, es comn tener dolor de garganta y nuseas. Tambin es comn sentirse cansado.  Pdale a un adulto responsable que permanezca con usted durante 24 horas despus de la anestesia general. Es importante que alguien cuide de usted hasta que se despierte y Cabin crew.  Cuando [employer], comience a comer cantidades pequeas de alimentos que sean blandos y fciles de Publishing copy (livianos), como una tostada. Retome su dieta habitual de forma gradual.  Beba suficiente lquido como para mantener la orina de color amarillo plido.  Reanude sus actividades normales segn lo indicado por el mdico. Pregntele al mdico qu actividades son seguras para usted. Esta informacin no tiene Marine scientist el consejo del mdico. Asegrese de hacerle al mdico cualquier pregunta que tenga. Document Revised: 10/14/2017 Document Reviewed: 10/14/2017 [name] Patient Education  [place] usar clorhexidina para baarse How to Use Chlorhexidine for Bathing El gluconato de clorhexidina (CHG) es una solucin desinfectante (antisptica) que se utiliza para limpiar la piel. Puede eliminar las  bacterias que normalmente viven en la piel y mantenerlas alejadas durante aproximadamente 24 horas. Para limpiarse la piel con CHG, es posible que le den lo siguiente:  Una solucin de CHG para usar en la ducha o como parte de un bao de [place].  Un pao preenvasado que contenga CHG. Limpiar la piel con CHG puede ayudar a disminuir el riesgo de infeccin:  Mientras permanece en la unidad de cuidados intensivos del hospital.  Si tiene un acceso vascular, como una va central, para proporcionar acceso a corto o largo plazo a las venas.  Si tiene un catter para que drene orina de la vejiga.  Si tiene Animal nutritionist. El respirador es un aparato que lo ayuda a Ambulance person al hacer que el aire entre y salga de sus pulmones.  Despus de [place]. Cules son  los riesgos? Los riesgos de usar de CHG incluyen los siguientes:  Reacciones en la piel.  Prdida de la audicin si el CHG ingresa en los odos.  Lesin ocular si el CHG ingresa en los ojos y no se enjuaga.  El CHG es inflamable. Asegrese de no fumar ni acercarse al fuego luego de aplicarse CHG en la piel. No use CHG:  Si es alrgico a la clorhexidina o anteriormente tuvo una reaccin a la clorhexidina.  En bebs de menos de 2 meses. Cmo usar la solucin de CHG  Use CHG nicamente como se lo indique su mdico y siga las instrucciones de la etiqueta.  Use la cantidad de CHG que le indicaron. Usualmente, la medida es una botella. Durante una ducha Siga estos pasos al usar la solucin de CHG durante una ducha (a menos que su mdico le brinde instrucciones diferentes): 1. Comience a ducharse. 2. Lvese la cara y el cabello con el jabn y el champ que utiliza habitualmente. [place] de abajo del agua. 4. Vierta el CHG en un pao limpio. No use ningn cepillo o esponja con bordes rugosos. 5. Comience en el cuello y colquese la espuma en todo el cuerpo [employer]. Asegrese de seguir estas  instrucciones: ? Si le harn una ciruga, preste especial atencin a la parte del cuerpo Research officer, political party. Frote la zona durante al menos 1 minuto. ? No use el CHG en su cabeza o cara. Si la solucin [employer] odos o los ojos, enjuague bien con [place]. ? Evite la zona genital. ? Evite las zonas de la piel que estn lastimadas o tengan cortes o raspaduras. ? Frote su espalda y [employer]. Asegrese de lavar los pliegues de la piel. 6. Deje actuar la espuma por 1 o 2 minutos, o por el tiempo que le haya indicado el mdico. 7. Enjuguese todo el cuerpo bajo la ducha. Asegrese de enjuagar bien todos los pliegues y hendiduras de la piel. 8. Seque con una toalla limpia. Despus no aplique ninguna sustancia en el cuerpo, como polvo, locin o perfume, excepto que se lo indique el mdico. Solo use las lociones recomendadas por el fabricante. 9. Pngase pijama o ropa limpia. 10. Si es la noche anterior a la ciruga, duerma en sbanas limpias.  Durante un bao de esponja Siga estos pasos al usar la solucin de CHG durante un bao de [place] (a menos que su mdico le brinde instrucciones diferentes): 1. Lvese la cara y el cabello con el jabn y el champ que utiliza habitualmente. 2. Vierta el CHG en un pao limpio. 3. Comience en el cuello y colquese la espuma en todo el cuerpo [employer]. Asegrese de seguir estas instrucciones: ? Si le harn una ciruga, preste especial atencin a la parte del cuerpo Research officer, political party. Frote la zona durante al menos 1 minuto. ? No use el CHG en su cabeza o cara. Si la solucin [employer] odos o los ojos, enjuague bien con [place]. ? Evite la zona genital. ? Evite las zonas de la piel que estn lastimadas o tengan cortes o raspaduras. ? Frote su espalda y [employer]. Asegrese de lavar los pliegues de la piel. 4. Deje actuar la espuma por 1 o 2 minutos, o por el tiempo que le haya indicado el mdico. 5. Con un pao  limpio y hmedo diferente, enjuguese bien todo el cuerpo. Asegrese de Acupuncturist  todos los pliegues y hendiduras de la piel. 6. Seque con una toalla limpia. Despus no aplique ninguna sustancia en el cuerpo, como polvo, locin o perfume, excepto que se lo indique el mdico. Solo use las lociones recomendadas por el fabricante. 7. Pngase pijama o ropa limpia. 8. Si es la noche anterior a la ciruga, duerma en sbanas limpias. [employer] paos preenvasados de CHG  Use los paos de CHG nicamente como se lo haya indicado el mdico y siga las instrucciones de la etiqueta.  Use los paos de CHG sobre la piel limpia y [place].  No use el pao de CHG en la cabeza o el rostro a menos que el mdico se lo indique.  Cuando lave con el pao de CHG: ? Evite la zona genital. ? Evite las zonas de la piel que estn lastimadas o tengan cortes o raspaduras. Antes de la ciruga Siga estos pasos al usar un pao de CHG para limpiar antes de una ciruga (a menos que su mdico le brinde instrucciones diferentes): 1. Con el pao de CHG, frtese con firmeza la parte del cuerpo donde le realizarn la ciruga. Frtese de un lado al otro durante 3 minutos. El rea del cuerpo debe estar completamente hmeda con CHG cuando termine de frotar. 2. No enjuague. Deseche el pao y deje que el rea se seque al aire. Despus no aplique ninguna sustancia sobre el rea, como polvos, lociones o perfume. 3. Pngase pijama o ropa limpia. 4. Si es la noche anterior a la ciruga, duerma en sbanas limpias.  Para baarse en general Siga estos pasos al usar el pao de CHG para baarse en general (a menos que su mdico le brinde instrucciones diferentes). 1. Use un pao de CHG diferente en cada rea del cuerpo. Asegrese de [employer] de la piel y [place] dedos de las manos y de los pies. Lvese el cuerpo en el siguiente orden, usando un pao nuevo despus de cada paso: ? La parte delantera del cuello, los hombros  y [place]. ? Ambos brazos, debajo de los brazos y [place]. ? El estmago y la zona de la ingle, evitando los genitales. ? La pierna y el pie derechos. ? La pierna y el pie izquierdos. ? La parte posterior del cuello, la espalda y las nalgas. 2. No enjuague. Deseche el pao y deje que el rea se seque al aire. Despus no aplique ninguna sustancia en el cuerpo, como polvo, locin o perfume, excepto que se lo indique el mdico. Solo use las lociones recomendadas por el fabricante. 3. Pngase pijama o ropa limpia. Comunquese con un mdico si:  La piel se irrita despus de frotarla.  Tiene preguntas sobre cmo usar la solucin o el pao. Solicite ayuda inmediatamente si:  Los ojos se ponen muy rojos o Cytogeneticist.  Siente picazn intensa en los ojos.  Siente picazn intensa en la piel y est roja o hinchada.  Nota cambios en su audicin.  Tiene dificultad para ver.  Tiene hinchazn u hormigueo en la garganta o la boca.  Tiene dificultad para respirar.  Traga un poco de clorhexidina. Resumen  El gluconato de clorhexidina (CHG) es una solucin desinfectante (antisptica) que se utiliza para limpiar la piel. Limpiar la piel con CHG puede ayudar a disminuir el riesgo de infeccin.  Es posible que le indiquen que utilice CHG para baarse. Esta solucin puede venir en botella o en un pao preenvasado para que use sobre su piel. Siga  cuidadosamente las instrucciones del mdico y las instrucciones que se encuentran en la etiqueta del producto.  No use CHG si es alrgico a la clorhexidina.  Pngase en contacto con su mdico si se le irrita la piel luego de frotar. Esta informacin no tiene Marine scientist el consejo del mdico. Asegrese de hacerle al mdico cualquier pregunta que tenga. Document Revised: 04/07/2019 Document Reviewed: 01/18/2018 [name] Patient Education  [place].

## 2020-05-16 ENCOUNTER — Other Ambulatory Visit (HOSPITAL_COMMUNITY)
Admission: RE | Admit: 2020-05-16 | Discharge: 2020-05-16 | Disposition: A | Discharge status: Home / Self Care | Payer: Self-pay | Source: Ambulatory Visit | Attending: Obstetrics & Gynecology | Admitting: Obstetrics & Gynecology

## 2020-05-16 ENCOUNTER — Encounter (HOSPITAL_COMMUNITY)
Admission: RE | Admit: 2020-05-16 | Discharge: 2020-05-16 | Disposition: A | Discharge status: Home / Self Care | Payer: Self-pay | Source: Ambulatory Visit | Attending: Obstetrics & Gynecology | Admitting: Obstetrics & Gynecology

## 2020-05-16 ENCOUNTER — Encounter (HOSPITAL_COMMUNITY): Payer: Self-pay

## 2020-05-16 ENCOUNTER — Other Ambulatory Visit: Payer: Self-pay

## 2020-05-16 DIAGNOSIS — Z01812 Encounter for preprocedural laboratory examination: Secondary | ICD-10-CM | POA: Insufficient documentation

## 2020-05-16 DIAGNOSIS — Z20822 Contact with and (suspected) exposure to covid-19: Secondary | ICD-10-CM | POA: Insufficient documentation

## 2020-05-16 LAB — COMPREHENSIVE METABOLIC PANEL
ALT: 120 U/L — ABNORMAL HIGH (ref 0–44)
AST: 74 U/L — ABNORMAL HIGH (ref 15–41)
Albumin: 4.1 g/dL (ref 3.5–5.0)
Alkaline Phosphatase: 249 U/L — ABNORMAL HIGH (ref 38–126)
Anion gap: 8 (ref 5–15)
BUN: 12 mg/dL (ref 6–20)
CO2: 21 mmol/L — ABNORMAL LOW (ref 22–32)
Calcium: 8.7 mg/dL — ABNORMAL LOW (ref 8.9–10.3)
Chloride: 106 mmol/L (ref 98–111)
Creatinine, Ser: 0.36 mg/dL — ABNORMAL LOW (ref 0.44–1.00)
GFR calc Af Amer: >60 mL/min (ref >60)
GFR calc non Af Amer: >60 mL/min (ref >60)
Glucose, Bld: 92 mg/dL (ref 70–99)
Potassium: 3.7 mmol/L (ref 3.5–5.1)
Sodium: 135 mmol/L (ref 135–145)
Total Bilirubin: 0.7 mg/dL (ref 0.3–1.2)
Total Protein: 7.4 g/dL (ref 6.5–8.1)

## 2020-05-16 LAB — URINALYSIS, ROUTINE W REFLEX MICROSCOPIC
Bilirubin Urine: NEGATIVE
Glucose, UA: NEGATIVE mg/dL
Ketones, ur: NEGATIVE mg/dL
Nitrite: NEGATIVE
Protein, ur: NEGATIVE mg/dL
Specific Gravity, Urine: 1.009 (ref 1.005–1.030)
pH: 6 (ref 5.0–8.0)

## 2020-05-16 LAB — TYPE AND SCREEN
ABO/RH(D): O POS
Antibody Screen: NEGATIVE

## 2020-05-16 LAB — HCG, QUANTITATIVE, PREGNANCY: hCG, Beta Chain, Quant, S: <1 m[IU]/mL (ref <5)

## 2020-05-16 LAB — CBC
HCT: 40 % (ref 36.0–46.0)
Hemoglobin: 13.3 g/dL (ref 12.0–15.0)
MCH: 31.3 pg (ref 26.0–34.0)
MCHC: 33.3 g/dL (ref 30.0–36.0)
MCV: 94.1 fL (ref 80.0–100.0)
Platelets: 282 10*3/uL (ref 150–400)
RBC: 4.25 MIL/uL (ref 3.87–5.11)
RDW: 12.4 % (ref 11.5–15.5)
WBC: 8.3 10*3/uL (ref 4.0–10.5)
nRBC: 0 % (ref 0.0–0.2)

## 2020-05-16 LAB — ABO/RH: ABO/RH(D): O POS

## 2020-05-16 LAB — RAPID HIV SCREEN (HIV 1/2 AB+AG)
HIV 1/2 Antibodies: NONREACTIVE
HIV-1 P24 Antigen - HIV24: NONREACTIVE

## 2020-05-17 LAB — SARS CORONAVIRUS 2 (TAT 6-24 HRS): SARS Coronavirus 2: NEGATIVE

## 2020-05-18 ENCOUNTER — Ambulatory Visit (HOSPITAL_COMMUNITY): Payer: Self-pay | Admitting: Anesthesiology

## 2020-05-18 ENCOUNTER — Encounter (HOSPITAL_COMMUNITY)
Admission: RE | Disposition: A | Discharge status: Home / Self Care | Payer: Self-pay | Source: Home / Self Care | Attending: Obstetrics & Gynecology

## 2020-05-18 ENCOUNTER — Encounter (HOSPITAL_COMMUNITY): Payer: Self-pay | Admitting: Obstetrics & Gynecology

## 2020-05-18 ENCOUNTER — Ambulatory Visit (HOSPITAL_COMMUNITY)
Admission: RE | Admit: 2020-05-18 | Discharge: 2020-05-18 | Disposition: A | Discharge status: Home / Self Care | Payer: Self-pay | Attending: Obstetrics & Gynecology | Admitting: Obstetrics & Gynecology

## 2020-05-18 ENCOUNTER — Other Ambulatory Visit: Payer: Self-pay

## 2020-05-18 DIAGNOSIS — D270 Benign neoplasm of right ovary: Secondary | ICD-10-CM | POA: Insufficient documentation

## 2020-05-18 DIAGNOSIS — N83511 Torsion of right ovary and ovarian pedicle: Secondary | ICD-10-CM | POA: Insufficient documentation

## 2020-05-18 DIAGNOSIS — N8353 Torsion of ovary, ovarian pedicle and fallopian tube: Secondary | ICD-10-CM

## 2020-05-18 HISTORY — PX: LAPAROSCOPIC UNILATERAL SALPINGO OOPHERECTOMY: SHX5935

## 2020-05-18 LAB — TYPE AND SCREEN
ABO/RH(D): O POS
Antibody Screen: NEGATIVE

## 2020-05-18 SURGERY — SALPINGO-OOPHORECTOMY, UNILATERAL, LAPAROSCOPIC
Anesthesia: General | Site: Abdomen | Laterality: Right

## 2020-05-18 MED ORDER — DEXAMETHASONE SODIUM PHOSPHATE 10 MG/ML IJ SOLN
INTRAMUSCULAR | Status: AC
  Filled 2020-05-18: qty 1

## 2020-05-18 MED ORDER — LIDOCAINE 2% (20 MG/ML) 5 ML SYRINGE
INTRAMUSCULAR | Status: DC | PRN
  Administered 2020-05-18: 80 mg via INTRAVENOUS

## 2020-05-18 MED ORDER — ONDANSETRON HCL 4 MG/2ML IJ SOLN: 4.0000 mg | Freq: Once | INTRAMUSCULAR | Status: DC | PRN

## 2020-05-18 MED ORDER — ONDANSETRON HCL 4 MG/2ML IJ SOLN
INTRAMUSCULAR | Status: DC | PRN
  Administered 2020-05-18: 4 mg via INTRAVENOUS

## 2020-05-18 MED ORDER — KETOROLAC TROMETHAMINE 10 MG PO TABS
10.0000 mg | ORAL_TABLET | Freq: Three times a day (TID) | ORAL | 0 refills | Status: DC | PRN
Start: 2020-05-18 — End: 2020-06-20

## 2020-05-18 MED ORDER — DEXAMETHASONE SODIUM PHOSPHATE 4 MG/ML IJ SOLN
INTRAMUSCULAR | Status: DC | PRN
  Administered 2020-05-18: 8 mg via INTRAVENOUS

## 2020-05-18 MED ORDER — KETOROLAC TROMETHAMINE 30 MG/ML IJ SOLN
INTRAMUSCULAR | Status: AC
  Filled 2020-05-18: qty 1

## 2020-05-18 MED ORDER — ROCURONIUM BROMIDE 100 MG/10ML IV SOLN
INTRAVENOUS | Status: DC | PRN
  Administered 2020-05-18: 5 mg via INTRAVENOUS
  Administered 2020-05-18: 45 mg via INTRAVENOUS

## 2020-05-18 MED ORDER — CEFAZOLIN SODIUM-DEXTROSE 2-4 GM/100ML-% IV SOLN
INTRAVENOUS | Status: AC
  Filled 2020-05-18: qty 100

## 2020-05-18 MED ORDER — BUPIVACAINE LIPOSOME 1.3 % IJ SUSP
20.0000 mL | Freq: Once | INTRAMUSCULAR | Status: DC
  Filled 2020-05-18: qty 20

## 2020-05-18 MED ORDER — ONDANSETRON 8 MG PO TBDP
8.0000 mg | ORAL_TABLET | Freq: Three times a day (TID) | ORAL | 0 refills | Status: DC | PRN
Start: 2020-05-18 — End: 2022-05-17

## 2020-05-18 MED ORDER — 0.9 % SODIUM CHLORIDE (POUR BTL) OPTIME
TOPICAL | Status: DC | PRN
  Administered 2020-05-18: 1000 mL

## 2020-05-18 MED ORDER — BUPIVACAINE LIPOSOME 1.3 % IJ SUSP
INTRAMUSCULAR | Status: AC
  Filled 2020-05-18: qty 20

## 2020-05-18 MED ORDER — FENTANYL CITRATE (PF) 250 MCG/5ML IJ SOLN
INTRAMUSCULAR | Status: AC
  Filled 2020-05-18: qty 5

## 2020-05-18 MED ORDER — MIDAZOLAM HCL 2 MG/2ML IJ SOLN
INTRAMUSCULAR | Status: AC
  Filled 2020-05-18: qty 2

## 2020-05-18 MED ORDER — BUPIVACAINE LIPOSOME 1.3 % IJ SUSP
INTRAMUSCULAR | Status: DC | PRN
  Administered 2020-05-18: 20 mL

## 2020-05-18 MED ORDER — MIDAZOLAM HCL 5 MG/5ML IJ SOLN
INTRAMUSCULAR | Status: DC | PRN
  Administered 2020-05-18: 2 mg via INTRAVENOUS

## 2020-05-18 MED ORDER — FENTANYL CITRATE (PF) 100 MCG/2ML IJ SOLN: 25.0000 ug | INTRAMUSCULAR | Status: DC | PRN

## 2020-05-18 MED ORDER — PROPOFOL 10 MG/ML IV BOLUS
INTRAVENOUS | Status: DC | PRN
  Administered 2020-05-18: 50 mg via INTRAVENOUS
  Administered 2020-05-18: 150 mg via INTRAVENOUS

## 2020-05-18 MED ORDER — FENTANYL CITRATE (PF) 100 MCG/2ML IJ SOLN
INTRAMUSCULAR | Status: DC | PRN
  Administered 2020-05-18: 50 ug via INTRAVENOUS
  Administered 2020-05-18: 150 ug via INTRAVENOUS
  Administered 2020-05-18: 50 ug via INTRAVENOUS

## 2020-05-18 MED ORDER — KETOROLAC TROMETHAMINE 30 MG/ML IJ SOLN
30.0000 mg | Freq: Once | INTRAMUSCULAR | Status: AC
  Administered 2020-05-18: 30 mg via INTRAVENOUS

## 2020-05-18 MED ORDER — SODIUM CHLORIDE 0.9 % IR SOLN
Status: DC | PRN
  Administered 2020-05-18: 3000 mL

## 2020-05-18 MED ORDER — LACTATED RINGERS IV SOLN: INTRAVENOUS | Status: DC

## 2020-05-18 MED ORDER — CEFAZOLIN SODIUM-DEXTROSE 2-4 GM/100ML-% IV SOLN
2.0000 g | INTRAVENOUS | Status: AC
  Administered 2020-05-18: 2 g via INTRAVENOUS

## 2020-05-18 MED ORDER — SUGAMMADEX SODIUM 200 MG/2ML IV SOLN
INTRAVENOUS | Status: DC | PRN
  Administered 2020-05-18: 200 mg via INTRAVENOUS

## 2020-05-18 MED ORDER — HYDROCODONE-ACETAMINOPHEN 5-325 MG PO TABS: 1.0000 | ORAL_TABLET | Freq: Four times a day (QID) | ORAL | 0 refills | Status: DC | PRN

## 2020-05-18 SURGICAL SUPPLY — 55 items
ADH SKN CLS APL DERMABOND .7 (GAUZE/BANDAGES/DRESSINGS) ×1
APPLIER CLIP ROT 10 11.4 M/L (STAPLE)
APPLIER CLIP UNV 5X34 EPIX (ENDOMECHANICALS) ×2 IMPLANT
APR CLP MED LRG 11.4X10 (STAPLE)
APR XCLPCLP 20M/L UNV 34X5 (ENDOMECHANICALS) ×1
BAG HAMPER (MISCELLANEOUS) ×2 IMPLANT
BLADE SURG SZ11 CARB STEEL (BLADE) ×2 IMPLANT
CLIP APPLIE ROT 10 11.4 M/L (STAPLE) IMPLANT
CLOTH BEACON ORANGE TIMEOUT ST (SAFETY) ×2 IMPLANT
COVER LIGHT HANDLE STERIS (MISCELLANEOUS) ×4 IMPLANT
COVER WAND RF STERILE (DRAPES) ×2 IMPLANT
DERMABOND ADVANCED (GAUZE/BANDAGES/DRESSINGS) ×1
DERMABOND ADVANCED .7 DNX12 (GAUZE/BANDAGES/DRESSINGS) IMPLANT
ELECT REM PT RETURN 9FT ADLT (ELECTROSURGICAL) ×2
ELECTRODE REM PT RTRN 9FT ADLT (ELECTROSURGICAL) ×1 IMPLANT
FILTER SMOKE EVAC LAPAROSHD (FILTER) ×2 IMPLANT
GAUZE 4X4 16PLY RFD (DISPOSABLE) ×2 IMPLANT
GLOVE BIO SURGEON STRL SZ7 (GLOVE) ×1 IMPLANT
GLOVE BIOGEL PI IND STRL 7.0 (GLOVE) ×2 IMPLANT
GLOVE BIOGEL PI IND STRL 8 (GLOVE) ×1 IMPLANT
GLOVE BIOGEL PI INDICATOR 7.0 (GLOVE) ×2
GLOVE BIOGEL PI INDICATOR 8 (GLOVE) ×1
GLOVE ECLIPSE 8.0 STRL XLNG CF (GLOVE) ×2 IMPLANT
GOWN STRL REUS W/TWL LRG LVL3 (GOWN DISPOSABLE) ×2 IMPLANT
GOWN STRL REUS W/TWL XL LVL3 (GOWN DISPOSABLE) ×2 IMPLANT
INST SET LAPROSCOPIC GYN AP (KITS) ×2 IMPLANT
IV NS IRRIG 3000ML ARTHROMATIC (IV SOLUTION) ×1 IMPLANT
KIT TROCAR LAP GYN (TROCAR) ×2 IMPLANT
KIT TURNOVER KIT A (KITS) ×2 IMPLANT
MANIFOLD NEPTUNE II (INSTRUMENTS) ×2 IMPLANT
NDL HYPO 18GX1.5 BLUNT FILL (NEEDLE) ×1 IMPLANT
NEEDLE HYPO 18GX1.5 BLUNT FILL (NEEDLE) ×2 IMPLANT
NEEDLE HYPO 22GX1.5 SAFETY (NEEDLE) ×2 IMPLANT
NEEDLE INSUFFLATION 120MM (ENDOMECHANICALS) ×2 IMPLANT
NS IRRIG 1000ML POUR BTL (IV SOLUTION) ×1 IMPLANT
PACK PERI GYN (CUSTOM PROCEDURE TRAY) ×2 IMPLANT
PAD ARMBOARD 7.5X6 YLW CONV (MISCELLANEOUS) ×2 IMPLANT
SET BASIN LINEN APH (SET/KITS/TRAYS/PACK) ×2 IMPLANT
SET TUBE IRRIG SUCTION NO TIP (IRRIGATION / IRRIGATOR) ×1 IMPLANT
SHEARS HARMONIC ACE PLUS 36CM (ENDOMECHANICALS) ×2 IMPLANT
SLEEVE XCEL OPT CAN 5 100 (ENDOMECHANICALS) ×2 IMPLANT
SOL ANTI FOG 6CC (MISCELLANEOUS) ×1 IMPLANT
SOLUTION ANTI FOG 6CC (MISCELLANEOUS) ×1
SPONGE GAUZE 2X2 8PLY STRL LF (GAUZE/BANDAGES/DRESSINGS) ×6 IMPLANT
STAPLER VISISTAT 35W (STAPLE) ×2 IMPLANT
SUT VICRYL 0 UR6 27IN ABS (SUTURE) ×2 IMPLANT
SYR 20ML LL LF (SYRINGE) ×4 IMPLANT
SYS BAG RETRIEVAL 10MM (BASKET) ×2
SYSTEM BAG RETRIEVAL 10MM (BASKET) IMPLANT
TRAY FOLEY W/BAG SLVR 16FR (SET/KITS/TRAYS/PACK) ×2
TRAY FOLEY W/BAG SLVR 16FR ST (SET/KITS/TRAYS/PACK) ×1 IMPLANT
TROCAR XCEL NON-BLD 11X100MML (ENDOMECHANICALS) ×2 IMPLANT
TROCAR XCEL NON-BLD 5MMX100MML (ENDOMECHANICALS) ×2 IMPLANT
TUBING HI FLO HEAT INSUFFLATOR (IRRIGATION / IRRIGATOR) ×2 IMPLANT
WARMER LAPAROSCOPE (MISCELLANEOUS) ×2 IMPLANT

## 2020-05-18 NOTE — Op Note (Signed)
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  The mass was concerning for an endometrioma but intraoperatively it was obviously not an endometrioma It appeared to be a serous cystadenoma There was a full turn of the utero-ovarian pedicle The left ovary and tube and other peritoneal surfaces were all normal and appeared unaffected There were no excrescences or other abnormalities of the right ovary  Description of operation: Patient was taken to the operating room placed in the supine position where she underwent general tracheal anesthesia She was placed in low lithotomy position and prepped and draped in the usual sterile fashion Foley catheter was placed Incision was made in the umbilicus The umbilical fascia was grasped with Coker clamps A Veres needle was placed into the peritoneal cavity with 1 pass without difficulty The peritoneal cavity was insufflated An 11 mm nonbladed direct visualization laparoscopic trocar was placed into the peritoneal cavity with 1 pass light difficulty under direct laparoscopic visualization The peritoneal cavity was confirmed Incisions were made in the right and left lower quadrants 5 mm nonbladed trochars were placed into each lower quadrant incision with 1 pass without difficulty under direct laparoscopic visualization The above-noted findings were seen The infundibulopelvic ligament of the right ovary was transected using the harmonic scalpel with good hemostasis The utero-ovarian ligament was transected using the harmonic scalpel with good hemostasis The pelvis  was irrigated and the pedicles were found to be hemostatic I then placed an Endo Catch bag and was unable to get the mass and ovary in the bag in total so I made a small perforation in the surface and some of the serous fluid was removed The right adnexa was then placed in the Endo Catch bag and removed from the peritoneal cavity without difficulty The serous fluid was suctioned from the peritoneal cavity The pelvis was irrigated once again Both lower quadrant trochars were removed The umbilical fascia was closed using 2-0 Vicryl All 3 skin incisions were closed using 3-0 Vicryl in a subcuticular fashion Dermabond was placed over each incision A total of 20 cc or 266 mg of Exparel was injected into the 3 incisions 5 cc each into the lower quadrant incisions The remaining 10 cc in the umbilical incision  The patient tolerated procedure well She experienced minimal blood loss She received Ancef and Toradol preoperatively prophylactically She was taken to the recovery room good stable condition all counts were correct x3 She will be seen back in the office in approximately 1 week for postoperative visit  Florian Buff, MD 05/18/2020 11:54 AM

## 2020-05-18 NOTE — Anesthesia Procedure Notes (Signed)
Procedure Name: Intubation Performed by: Tacy Learn, CRNA Pre-anesthesia Checklist: Patient identified, Emergency Drugs available, Suction available, Patient being monitored and Timeout performed Patient Re-evaluated:Patient Re-evaluated prior to induction Oxygen Delivery Method: Circle system utilized Preoxygenation: Pre-oxygenation with 100% oxygen Induction Type: IV induction Laryngoscope Size: Miller and 2 Grade View: Grade I Tube type: Oral Tube size: 7.0 mm Number of attempts: 1 Airway Equipment and Method: Stylet Placement Confirmation: ETT inserted through vocal cords under direct vision Secured at: 21 cm Tube secured with: Tape Dental Injury: Teeth and Oropharynx as per pre-operative assessment

## 2020-05-18 NOTE — H&P (Signed)
Preoperative History and Physical  Bonnie Fleming is a 49 y.o. No obstetric history on file. with No LMP recorded. Patient has had an injection. admitted for a laparoscopic RSO for management of a 9 cm complex right ovarian mass causing pain.  CA 125 normal.  Probable endometroma  PMH:   History reviewed. No pertinent past medical history.  PSH:     Past Surgical History:  Procedure Laterality Date  . CHOLECYSTECTOMY      POb/GynH:      OB History   No obstetric history on file.     SH:   Social History   Tobacco Use  . Smoking status: Never Smoker  . Smokeless tobacco: Never Used  Substance Use Topics  . Alcohol use: No  . Drug use: No    FH:    Family History  Problem Relation Age of Onset  . Diabetes Father      Allergies: No Known Allergies  Medications:       Current Facility-Administered Medications:  .  0.9 % irrigation (POUR BTL), , , PRN, Florian Buff, MD, 1,000 mL at 05/18/20 1014 .  bupivacaine liposome (EXPAREL) 1.3 % injection 266 mg, 20 mL, Infiltration, Once, Caral Whan H, MD .  ceFAZolin (ANCEF) IVPB 2g/100 mL premix, 2 g, Intravenous, On Call to OR, Florian Buff, MD .  lactated ringers infusion, , Intravenous, Continuous, Briant Cedar, Coralie Keens, MD, Last Rate: 50 mL/hr at 05/18/20 0840, New Bag at 05/18/20 0840  Review of Systems:   Review of Systems  Constitutional: Negative for fever, chills, weight loss, malaise/fatigue and diaphoresis.  HENT: Negative for hearing loss, ear pain, nosebleeds, congestion, sore throat, neck pain, tinnitus and ear discharge.   Eyes: Negative for blurred vision, double vision, photophobia, pain, discharge and redness.  Respiratory: Negative for cough, hemoptysis, sputum production, shortness of breath, wheezing and stridor.   Cardiovascular: Negative for chest pain, palpitations, orthopnea, claudication, leg swelling and PND.  Gastrointestinal: Positive for abdominal pain. Negative for heartburn, nausea,  vomiting, diarrhea, constipation, blood in stool and melena.  Genitourinary: Negative for dysuria, urgency, frequency, hematuria and flank pain.  Musculoskeletal: Negative for myalgias, back pain, joint pain and falls.  Skin: Negative for itching and rash.  Neurological: Negative for dizziness, tingling, tremors, sensory change, speech change, focal weakness, seizures, loss of consciousness, weakness and headaches.  Endo/Heme/Allergies: Negative for environmental allergies and polydipsia. Does not bruise/bleed easily.  Psychiatric/Behavioral: Negative for depression, suicidal ideas, hallucinations, memory loss and substance abuse. The patient is not nervous/anxious and does not have insomnia.      PHYSICAL EXAM:  Blood pressure 119/70, pulse 72, temperature 98.4 F (36.9 C), temperature source Oral, resp. rate 15, height 5' (1.524 m), weight 66.7 kg, SpO2 98 %.    Vitals reviewed. Constitutional: She is oriented to person, place, and time. She appears well-developed and well-nourished.  HENT:  Head: Normocephalic and atraumatic.  Right Ear: External ear normal.  Left Ear: External ear normal.  Nose: Nose normal.  Mouth/Throat: Oropharynx is clear and moist.  Eyes: Conjunctivae and EOM are normal. Pupils are equal, round, and reactive to light. Right eye exhibits no discharge. Left eye exhibits no discharge. No scleral icterus.  Neck: Normal range of motion. Neck supple. No tracheal deviation present. No thyromegaly present.  Cardiovascular: Normal rate, regular rhythm, normal heart sounds and intact distal pulses.  Exam reveals no gallop and no friction rub.   No murmur heard. Respiratory: Effort normal and breath sounds normal. No  respiratory distress. She has no wheezes. She has no rales. She exhibits no tenderness.  GI: Soft. Bowel sounds are normal. She exhibits no distension and no mass. There is tenderness. There is no rebound and no guarding.  Genitourinary:       Vulva is  normal without lesions Vagina is pink moist without discharge Cervix normal in appearance and pap is normal Uterus is normal size, contour, position, consistency, mobility, non-tender Adnexa is  by sonogram  Musculoskeletal: Normal range of motion. She exhibits no edema and no tenderness.  Neurological: She is alert and oriented to person, place, and time. She has normal reflexes. She displays normal reflexes. No cranial nerve deficit. She exhibits normal muscle tone. Coordination normal.  Skin: Skin is warm and dry. No rash noted. No erythema. No pallor.  Psychiatric: She has a normal mood and affect. Her behavior is normal. Judgment and thought content normal.    Labs: Results for orders placed or performed during the hospital encounter of 05/18/20 (from the past 336 hour(s))  Type and screen [facility]   Collection Time: 05/18/20  8:22 AM  Result Value Ref Range   ABO/RH(D) O POS    Antibody Screen NEG    Sample Expiration 06/01/2020,2359    Extend sample reason      NO TRANSFUSIONS OR PREGNANCY IN THE PAST 3 MONTHS Performed at [facility], [address], [place], [place] [address]   Results for orders placed or performed during the hospital encounter of 05/16/20 (from the past 336 hour(s))  SARS CORONAVIRUS 2 (TAT 6-24 HRS) Nasopharyngeal Nasopharyngeal Swab   Collection Time: 05/16/20  1:57 PM   Specimen: Nasopharyngeal Swab  Result Value Ref Range   SARS Coronavirus 2 NEGATIVE NEGATIVE  CBC   Collection Time: 05/16/20  2:01 PM  Result Value Ref Range   WBC 8.3 4.0 - 10.5 K/uL   RBC 4.25 3.87 - 5.11 MIL/uL   Hemoglobin 13.3 12.0 - 15.0 g/dL   HCT 40.0 36.0 - 46.0 %   MCV 94.1 80.0 - 100.0 fL   MCH 31.3 26.0 - 34.0 pg   MCHC 33.3 30.0 - 36.0 g/dL   RDW 12.4 11.5 - 15.5 %   Platelets 282 150 - 400 K/uL   nRBC 0.0 0.0 - 0.2 %  Comprehensive metabolic panel   Collection Time: 05/16/20  2:01 PM  Result Value Ref Range   Sodium 135 135 - 145 mmol/L   Potassium  3.7 3.5 - 5.1 mmol/L   Chloride 106 98 - 111 mmol/L   CO2 21 (L) 22 - 32 mmol/L   Glucose, Bld 92 70 - 99 mg/dL   BUN 12 6 - 20 mg/dL   Creatinine, Ser 0.36 (L) 0.44 - 1.00 mg/dL   Calcium 8.7 (L) 8.9 - 10.3 mg/dL   Total Protein 7.4 6.5 - 8.1 g/dL   Albumin 4.1 3.5 - 5.0 g/dL   AST 74 (H) 15 - 41 U/L   ALT 120 (H) 0 - 44 U/L   Alkaline Phosphatase 249 (H) 38 - 126 U/L   Total Bilirubin 0.7 0.3 - 1.2 mg/dL   GFR calc non Af Amer >60 >60 mL/min   GFR calc Af Amer >60 >60 mL/min   Anion gap 8 5 - 15  hCG, quantitative, pregnancy   Collection Time: 05/16/20  2:01 PM  Result Value Ref Range   hCG, Beta Chain, Quant, S <1 <5 mIU/mL  Rapid HIV screen (HIV 1/2 Ab+Ag)   Collection Time:  05/16/20  2:01 PM  Result Value Ref Range   HIV-1 P24 Antigen - HIV24 NON REACTIVE NON REACTIVE   HIV 1/2 Antibodies NON REACTIVE NON REACTIVE   Interpretation (HIV Ag Ab)      A non reactive test result means that HIV 1 or HIV 2 antibodies and HIV 1 p24 antigen were not detected in the specimen.  Type and screen   Collection Time: 05/16/20  2:01 PM  Result Value Ref Range   ABO/RH(D) O POS    Antibody Screen NEG    Sample Expiration 05/30/2020,2359    Extend sample reason      NO TRANSFUSIONS OR PREGNANCY IN THE PAST 3 MONTHS Performed at [facility], [address], [place], [place] [address]   Urinalysis, Routine w reflex microscopic   Collection Time: 05/16/20  2:30 PM  Result Value Ref Range   Color, Urine STRAW (A) YELLOW   APPearance CLEAR CLEAR   Specific Gravity, Urine 1.009 1.005 - 1.030   pH 6.0 5.0 - 8.0   Glucose, UA NEGATIVE NEGATIVE mg/dL   Hgb urine dipstick SMALL (A) NEGATIVE   Bilirubin Urine NEGATIVE NEGATIVE   Ketones, ur NEGATIVE NEGATIVE mg/dL   Protein, ur NEGATIVE NEGATIVE mg/dL   Nitrite NEGATIVE NEGATIVE   Leukocytes,Ua TRACE (A) NEGATIVE   RBC / HPF 0-5 0 - 5 RBC/hpf   WBC, UA 0-5 0 - 5 WBC/hpf   Bacteria, UA RARE (A) NONE SEEN   Squamous Epithelial / LPF 0-5 0  - 5  Results for orders placed or performed during the hospital encounter of 05/16/20 (from the past 336 hour(s))  ABO/Rh   Collection Time: 05/16/20  2:01 PM  Result Value Ref Range   ABO/RH(D)      O POS Performed at [facility], [address] [place], [place] [address]     EKG: No orders found for this or any previous visit.  Imaging Studies: No results found.    Assessment: Right ovarian mass, probable endometrioma RLQ pain  Plan: Laparoscopic Marzetta Board 05/18/2020 10:23 AM

## 2020-05-18 NOTE — Progress Notes (Signed)
Discharge instructions reviewed with English speaking daughter, Instructions are printed in [place].  Questions answered and copies given

## 2020-05-18 NOTE — Anesthesia Preprocedure Evaluation (Signed)
Anesthesia Evaluation  Patient identified by MRN, date of birth, ID band Patient awake    Reviewed: Allergy & Precautions, H&P , NPO status , Patient's Chart, lab work & pertinent test results, reviewed documented beta blocker date and time   Airway Mallampati: II  TM Distance: >3 FB Neck ROM: full    Dental no notable dental hx. (+) Teeth Intact   Pulmonary neg pulmonary ROS,    Pulmonary exam normal breath sounds clear to auscultation       Cardiovascular Exercise Tolerance: Good negative cardio ROS   Rhythm:regular Rate:Normal     Neuro/Psych negative neurological ROS  negative psych ROS   GI/Hepatic negative GI ROS, Neg liver ROS,   Endo/Other  negative endocrine ROS  Renal/GU negative Renal ROS  negative genitourinary   Musculoskeletal   Abdominal   Peds  Hematology negative hematology ROS (+)   Anesthesia Other Findings   Reproductive/Obstetrics negative OB ROS                             Anesthesia Physical Anesthesia Plan  ASA: II  Anesthesia Plan: General   Post-op Pain Management:    Induction:   PONV Risk Score and Plan: 3 and Ondansetron  Airway Management Planned:   Additional Equipment:   Intra-op Plan:   Post-operative Plan:   Informed Consent: I have reviewed the patients History and Physical, chart, labs and discussed the procedure including the risks, benefits and alternatives for the proposed anesthesia with the patient or authorized representative who has indicated his/her understanding and acceptance.     Dental Advisory Given  Plan Discussed with: CRNA  Anesthesia Plan Comments:         Anesthesia Quick Evaluation

## 2020-05-18 NOTE — Transfer of Care (Signed)
Immediate Anesthesia Transfer of Care Note  Patient: Katina Woo  Procedure(s) Performed: LAPAROSCOPIC RIGHT SALPINGO OOPHORECTOMY (Right Abdomen)  Patient Location: PACU  Anesthesia Type:General  Level of Consciousness: awake, alert , oriented and patient cooperative  Airway & Oxygen Therapy: Patient Spontanous Breathing and Patient connected to face mask oxygen  Post-op Assessment: Report given to RN, Post -op Vital signs reviewed and stable and Patient moving all extremities X 4  Post vital signs: Reviewed and stable  Last Vitals:  Vitals Value Taken Time  BP 110/65 05/18/20 1149  Temp    Pulse 80 05/18/20 1150  Resp 16 05/18/20 1150  SpO2 100 % 05/18/20 1150  Vitals shown include unvalidated device data.  Last Pain:  Vitals:   05/18/20 0819  TempSrc: Oral  PainSc: 0-No pain      Patients Stated Pain Goal: 4 ([id number])  Complications: No apparent anesthesia complications

## 2020-05-18 NOTE — Discharge Instructions (Addendum)
Anestesia general en adultos, cuidados posteriores General Anesthesia, Adult, Care After Lea esta informacin sobre cmo cuidarse despus del procedimiento. El mdico tambin podr darle instrucciones ms especficas. Comunquese con su mdico si tiene problemas o preguntas. Qu puedo esperar despus del procedimiento? Luego del procedimiento, son comunes los siguiente efectos secundarios:  Dolor o molestias en el lugar de la va intravenosa (i.v.).  Nuseas.  Vmitos.  Dolor de garganta.  Dificultad para concentrarse.  Sentir fro o tener escalofros.  Debilidad o cansancio.  Somnolencia y fatiga.  Malestar y dolor corporal. Estos efectos secundarios pueden afectar partes del cuerpo que no estuvieron involucradas en la ciruga. Siga estas indicaciones en su casa:  Durante al menos 24horas despus del procedimiento:  Pdale a un adulto responsable que permanezca con usted. Es importante que alguien cuide de usted hasta que se despierte y est alerta.  Descanse todo lo que sea necesario.  No haga lo siguiente: ? Participar en actividades en las que podra caerse o lastimarse. ? Conducir. ? Operar maquinarias pesadas. ? Beber alcohol. ? Tomar somnferos o medicamentos que causen somnolencia. ? Firmar documentos legales ni tomar decisiones importantes. ? Cuidar a nios por su cuenta. Qu debe comer y beber  Siga las indicaciones del mdico respecto de las restricciones de comidas o bebidas.  Cuando tenga hambre, comience a comer cantidades pequeas de alimentos que sean blandos y fciles de digerir (livianos), como una tostada. Retome su dieta habitual de forma gradual.  Beba suficiente lquido como para mantener la orina de color amarillo plido.  Si vomita, rehidrtese tomando agua, jugo o caldo transparente. Instrucciones generales  Si tiene apnea del sueo, la ciruga y ciertos medicamentos pueden aumentar el riesgo de problemas respiratorios. Siga las  indicaciones del mdico respecto al uso de su dispositivo para dormir: ? Siempre que duerma, incluso durante las siestas que tome en el da. ? Mientras tome analgsicos recetados, medicamentos para dormir o medicamentos que producen somnolencia.  Reanude sus actividades normales segn lo indicado por el mdico. Pregntele al mdico qu actividades son seguras para usted.  Tome los medicamentos de venta libre y los recetados solamente como se lo haya indicado el mdico.  Si fuma, no lo haga sin supervisin.  Concurra a todas las visitas de seguimiento como se lo haya indicado el mdico. Esto es importante. Comunquese con un mdico si:  Tiene nuseas o vmitos que no mejoran con medicamentos.  No puede comer ni beber sin vomitar.  El dolor no se alivia con medicamentos.  No puede orinar.  Tiene una erupcin cutnea.  Tiene fiebre.  Presenta enrojecimiento alrededor del lugar de la va intravenosa (i.v.) que empeora. Solicite ayuda de inmediato si:  Tiene dificultad para respirar.  Siente dolor en el pecho.  Observa sangre en la orina o heces, o vomita sangre. Resumen  Despus del procedimiento, es comn tener dolor de garganta y nuseas. Tambin es comn sentirse cansado.  Pdale a un adulto responsable que permanezca con usted durante 24 horas despus de la anestesia general. Es importante que alguien cuide de usted hasta que se despierte y est alerta.  Cuando tenga hambre, comience a comer cantidades pequeas de alimentos que sean blandos y fciles de digerir (livianos), como una tostada. Retome su dieta habitual de forma gradual.  Beba suficiente lquido como para mantener la orina de color amarillo plido.  Reanude sus actividades normales segn lo indicado por el mdico. Pregntele al mdico qu actividades son seguras para usted. Esta informacin no tiene como fin reemplazar   el consejo del mdico. Asegrese de hacerle al mdico cualquier pregunta que tenga. Document  Revised: 10/14/2017 Document Reviewed: 10/14/2017 [name] Patient Education  2020 [place] unilateral, cuidados posteriores Unilateral Salpingo-Oophorectomy, Care After Lea esta informacin sobre cmo cuidarse despus del procedimiento. Su mdico tambin podr darle indicaciones ms especficas. Comunquese con su mdico si tiene problemas o preguntas. Qu puedo esperar despus del procedimiento? Despus del procedimiento, es comn [employer] siguientes sntomas:  Dolor abdominal.  Algn sangrado vaginal ocasional (pequeas manchas).  Cansancio. Siga estas indicaciones en su casa: Cuidados de la incisin   Mantenga la zona de la incisin y el vendaje limpios y secos.  Siga las indicaciones del mdico acerca del cuidado de la incisin. Haga lo siguiente: ? Lvese las manos con agua y [place] antes de cambiar el vendaje. Use desinfectante para manos si no dispone de [place] y [place]. ? Cambie el vendaje como se lo haya indicado el mdico. ? No retire los puntos (suturas), las grapas, la goma para cerrar la piel o las tiras [place]. Es posible que estos cierres cutneos Animal nutritionist en la piel durante 2semanas o ms tiempo. Si los bordes de las tiras adhesivas empiezan a despegarse y Therapist, sports, puede recortar los que estn sueltos. No retire las tiras [facility] por completo a menos que el mdico se lo indique.  [place] zona de la incisin para detectar signos de infeccin. Est atento a los siguientes signos: ? Dolor, hinchazn o enrojecimiento. ? Lquido o sangre. ? Calor. ? Pus o mal olor. Actividad  No conduzca ni use maquinaria pesada mientras toma analgsicos recetados.  No conduzca durante 24horas si le administraron un medicamento para ayudarlo a que se relaje (sedante).  Haga caminatas cortas y frecuentes Agricultural consultant. Descanse cuando se sienta cansada. Pregntele al mdico qu actividades son seguras para usted.  Evite  actividades que requieran un gran esfuerzo. Tambin, evite levantar pesos [employer]. No levante ningn objeto que pese ms de 5libras (2,3kg) o el lmite de peso que le indique su mdico hasta que este le diga que puede [place].  No se haga duchas vaginales, no use tampones ni tenga relaciones sexuales hasta que su mdico lo autorice. Instrucciones generales  A fin de prevenir o tratar el estreimiento mientras toma analgsicos recetados, el mdico puede recomendarle lo siguiente: ? Beber suficiente lquido para mantener la orina de color amarillo plido. ? Tomar medicamentos recetados o de [employer]. ? Consumir alimentos ricos en fibra, como frutas y verduras frescas, cereales integrales y frijoles. ? Limitar el consumo de alimentos ricos en grasas y azcares procesados, como alimentos fritos o dulces.  Tome los medicamentos de venta libre y los recetados solamente como se lo haya indicado el mdico.  No tome baos de inmersin, no nade ni use el jacuzzi hasta que el mdico lo autorice. Pregntele al mdico si puede ducharse. [name] solo le permitan darse baos de [place].  Use las medias de compresin como se lo haya indicado el mdico. Estas medias ayudan a Mining engineer formacin de cogulos de [place] y a reducir la hinchazn de las piernas.  Concurra a todas las visitas de control como se lo haya indicado el mdico. Esto es importante. Comunquese con un mdico si:  Siente dolor al [employer].  Tiene una secrecin con feo olor o pus que proviene de la vagina.  Tiene enrojecimiento, hinchazn o dolor alrededor de la incisin.  Le sale lquido o sangre de la incisin.  La incisin est  caliente al tacto.  Tiene pus o percibe que sale mal olor del lugar de la incisin.  Tiene fiebre.  La incisin comienza a abrirse.  Tiene un dolor abdominal que empeora o que no mejora con los medicamentos.  Le aparece una erupcin cutnea.  Presenta nuseas o vmitos.  Se siente  mareado. Solicite ayuda de inmediato si:  Tree surgeon en el pecho o en la pierna.  Le falta el aire.  Se desmaya.  Observa un aumento de la hemorragia vaginal. Resumen  Despus del procedimiento, es comn Patent attorney, cansancio y sangrado ocasional de la vagina.  Siga las indicaciones del mdico acerca del cuidado de la incisin.  Controle la incisin todos los das para detectar signos de infeccin e informe sobre cualquier sntoma a su mdico.  Siga las indicaciones del mdico respecto de las actividades y Futures trader. Esta informacin no tiene Marine scientist el consejo del mdico. Asegrese de hacerle al mdico cualquier pregunta que tenga. Document Revised: 07/23/2017 Document Reviewed: 07/23/2017 [name] Patient Education  [place].

## 2020-05-18 NOTE — Anesthesia Postprocedure Evaluation (Signed)
Anesthesia Post Note  Patient: Bonnie Fleming  Procedure(s) Performed: LAPAROSCOPIC RIGHT SALPINGO OOPHORECTOMY (Right Abdomen)  Anesthesia Type: General     Last Vitals:  Vitals:   05/18/20 1200 05/18/20 1215  BP: 111/65 112/71  Pulse: 72 86  Resp: 15 13  Temp:    SpO2: 100% 100%    Last Pain:  Vitals:   05/18/20 1215  TempSrc:   PainSc: 0-No pain                 Louann Sjogren

## 2020-05-19 LAB — SURGICAL PATHOLOGY

## 2020-05-20 NOTE — Addendum Note (Signed)
Addendum  created 05/20/20 1326 by Ollen Bowl, CRNA   Charge Capture section accepted

## 2020-05-27 ENCOUNTER — Ambulatory Visit (INDEPENDENT_AMBULATORY_CARE_PROVIDER_SITE_OTHER): Payer: Self-pay | Admitting: Obstetrics & Gynecology

## 2020-05-27 ENCOUNTER — Encounter: Payer: Self-pay | Admitting: Obstetrics & Gynecology

## 2020-05-27 VITALS — BP 110/74 | HR 78 | Ht 60.0 in | Wt 140.0 lb

## 2020-05-27 DIAGNOSIS — Z9079 Acquired absence of other genital organ(s): Secondary | ICD-10-CM

## 2020-05-27 DIAGNOSIS — Z90721 Acquired absence of ovaries, unilateral: Secondary | ICD-10-CM

## 2020-05-27 DIAGNOSIS — Z48816 Encounter for surgical aftercare following surgery on the genitourinary system: Secondary | ICD-10-CM

## 2020-05-27 DIAGNOSIS — Z9889 Other specified postprocedural states: Secondary | ICD-10-CM

## 2020-05-27 NOTE — Progress Notes (Signed)
  HPI: Patient returns for routine postoperative follow-up having undergone laparoscopic RSO on 05/18/20.  The patient's immediate postoperative recovery has been unremarkable. Since hospital discharge the patient reports no problems.   Current Outpatient Medications: .  HYDROcodone-acetaminophen (NORCO/VICODIN) 5-325 MG tablet, Take 1 tablet by mouth every 6 (six) hours as needed. (Patient not taking: Reported on 05/27/2020), Disp: 15 tablet, Rfl: 0 .  ketorolac (TORADOL) 10 MG tablet, Take 1 tablet (10 mg total) by mouth every 8 (eight) hours as needed. (Patient not taking: Reported on 05/27/2020), Disp: 15 tablet, Rfl: 0 .  ondansetron (ZOFRAN ODT) 8 MG disintegrating tablet, Take 1 tablet (8 mg total) by mouth every 8 (eight) hours as needed for nausea or vomiting. (Patient not taking: Reported on 05/27/2020), Disp: 8 tablet, Rfl: 0  No current facility-administered medications for this visit.    Blood pressure 110/74, pulse 78, height 5' (1.524 m), weight 140 lb (63.5 kg).  Physical Exam: Incisions x 3 normal  Abdomen is benign  Diagnostic Tests:   Pathology: Mucinous cystadenoma  Impression: S/p laparoscopic RSO  Plan:   Follow up: prn    Florian Buff, MD

## 2020-06-10 ENCOUNTER — Telehealth: Payer: Self-pay | Admitting: Obstetrics & Gynecology

## 2020-06-10 ENCOUNTER — Telehealth: Payer: Self-pay | Admitting: *Deleted

## 2020-06-10 NOTE — Telephone Encounter (Signed)
Patient states she is still having some pain following surgery.  Advised to try taking 800mg  Ibuprofen every 6 hours as needed to see if this will help. Verbalized understanding.

## 2020-06-10 NOTE — Telephone Encounter (Signed)
Patient called stating that she had surgery with Dr. Elonda Husky on 05/18/2020, and she is feeling pain and bloating. I scheduled her an appointment for 6/21 with Dr. Elonda Husky but patient states that she is still in a lot of pain and would like for Dr. Elonda Husky prescribe her medication for pain. Pt speaks spanish and her daughter is interpreting for her.

## 2020-06-16 ENCOUNTER — Ambulatory Visit (HOSPITAL_COMMUNITY): Payer: Self-pay

## 2020-06-20 ENCOUNTER — Ambulatory Visit (INDEPENDENT_AMBULATORY_CARE_PROVIDER_SITE_OTHER): Payer: Self-pay | Admitting: Obstetrics & Gynecology

## 2020-06-20 ENCOUNTER — Encounter: Payer: Self-pay | Admitting: Obstetrics & Gynecology

## 2020-06-20 VITALS — BP 121/77 | HR 86 | Ht 60.0 in | Wt 139.5 lb

## 2020-06-20 DIAGNOSIS — Z9889 Other specified postprocedural states: Secondary | ICD-10-CM

## 2020-06-20 NOTE — Progress Notes (Signed)
  HPI: Patient returns for routine postoperative follow-up having undergone lap RSO on 5/21.  The patient's immediate postoperative recovery has been unremarkable. Since hospital discharge the patient reports crampy abdominal pain from time to time.   Current Outpatient Medications: ibuprofen (ADVIL) 800 MG tablet, Take 800 mg by mouth as needed., Disp: , Rfl:  ondansetron (ZOFRAN ODT) 8 MG disintegrating tablet, Take 1 tablet (8 mg total) by mouth every 8 (eight) hours as needed for nausea or vomiting., Disp: 8 tablet, Rfl: 0  No current facility-administered medications for this visit.    Blood pressure 121/77, pulse 86, height 5' (1.524 m), weight 139 lb 8 oz (63.3 kg).  Physical Exam: incisionx x 3 all healing well No infection no bruising Abdomen is benign  Diagnostic Tests:   Pathology: benign  Impression: S/p laparoscopic RSO  Plan: Reassured her exam and post op course are normal  Follow up: prn    Florian Buff, MD

## 2020-06-30 ENCOUNTER — Other Ambulatory Visit: Payer: Self-pay

## 2020-06-30 ENCOUNTER — Ambulatory Visit (HOSPITAL_COMMUNITY)
Admission: RE | Admit: 2020-06-30 | Discharge: 2020-06-30 | Disposition: A | Discharge status: Home / Self Care | Payer: Self-pay | Source: Ambulatory Visit | Attending: Nurse Practitioner | Admitting: Nurse Practitioner

## 2020-06-30 DIAGNOSIS — Z1231 Encounter for screening mammogram for malignant neoplasm of breast: Secondary | ICD-10-CM | POA: Insufficient documentation

## 2020-11-22 ENCOUNTER — Other Ambulatory Visit (HOSPITAL_COMMUNITY): Payer: Self-pay | Admitting: *Deleted

## 2020-11-22 DIAGNOSIS — N644 Mastodynia: Secondary | ICD-10-CM

## 2020-12-20 ENCOUNTER — Ambulatory Visit (HOSPITAL_COMMUNITY)
Admission: RE | Admit: 2020-12-20 | Discharge: 2020-12-20 | Disposition: A | Discharge status: Home / Self Care | Payer: PRIVATE HEALTH INSURANCE | Source: Ambulatory Visit | Attending: *Deleted | Admitting: *Deleted

## 2020-12-20 ENCOUNTER — Other Ambulatory Visit: Payer: Self-pay

## 2020-12-20 DIAGNOSIS — N644 Mastodynia: Secondary | ICD-10-CM

## 2021-05-16 IMAGING — MG MM DIGITAL DIAGNOSTIC UNILAT*L* W/ TOMO W/ CAD
4 series · 4 of 12 positions shown · non-contrast
Comparison: Previous exams.

CLINICAL DATA: 49-year-old female with several areas of tenderness
in the left breast.

EXAM:
DIGITAL DIAGNOSTIC UNILATERAL LEFT MAMMOGRAM WITH TOMO AND CAD

[L CC synth-2D]
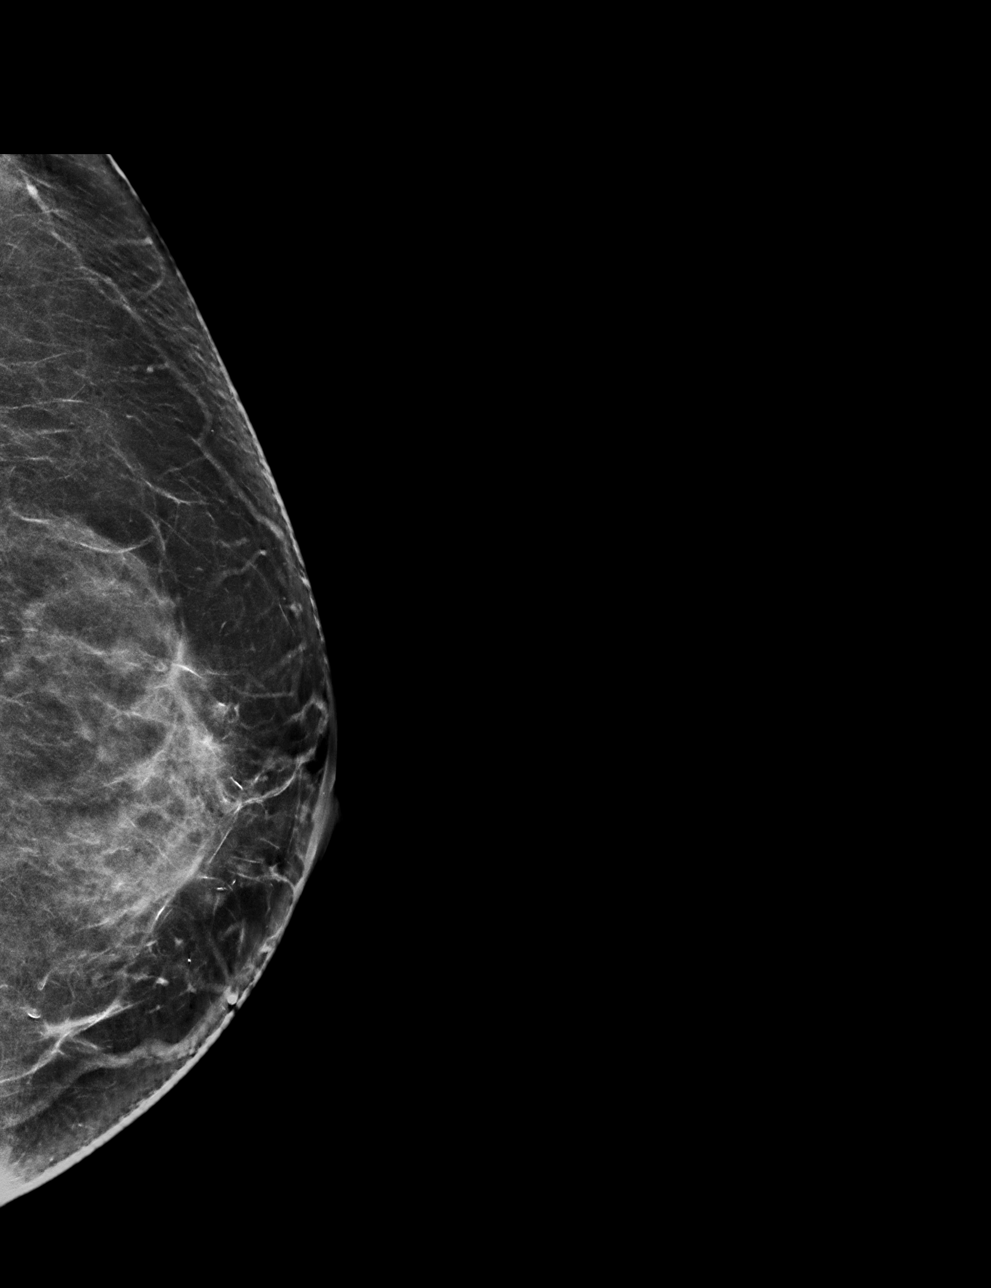

[L MLO synth-2D]
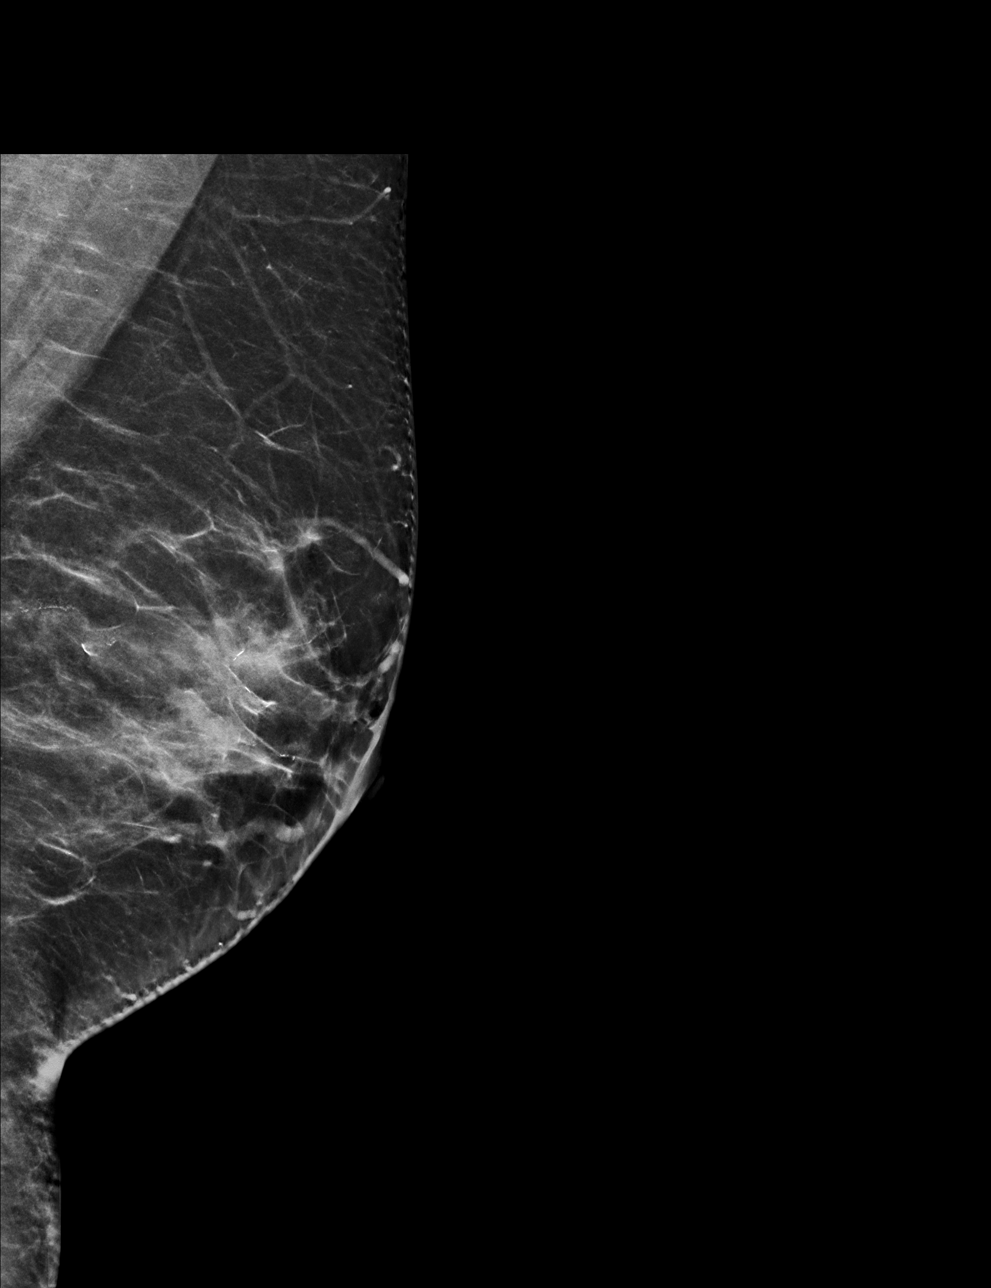

[L CC tomo · tomo slice 35/70.0]
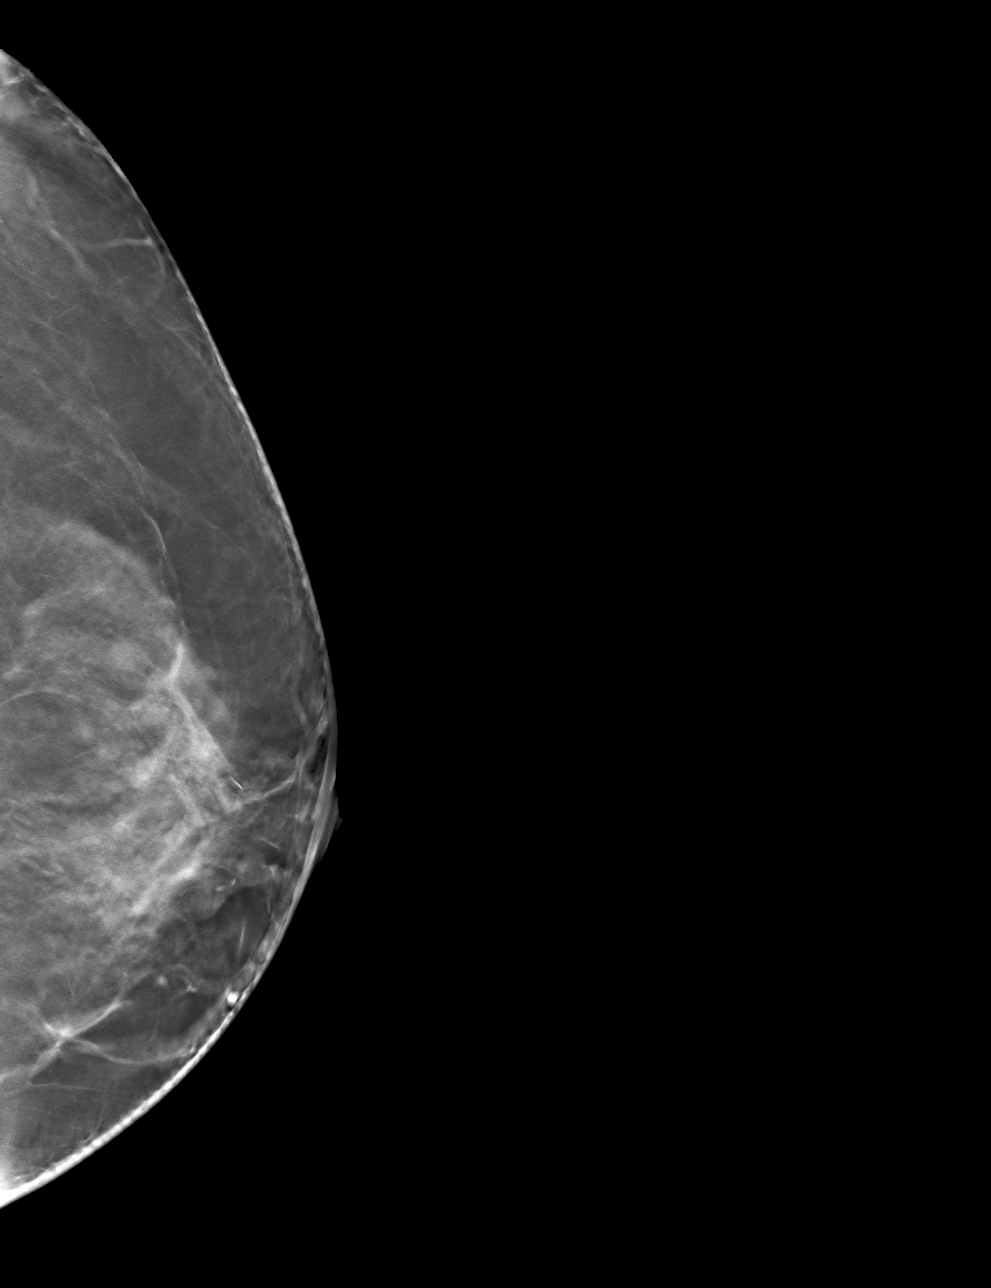

[L MLO tomo · tomo slice 35/69.0]
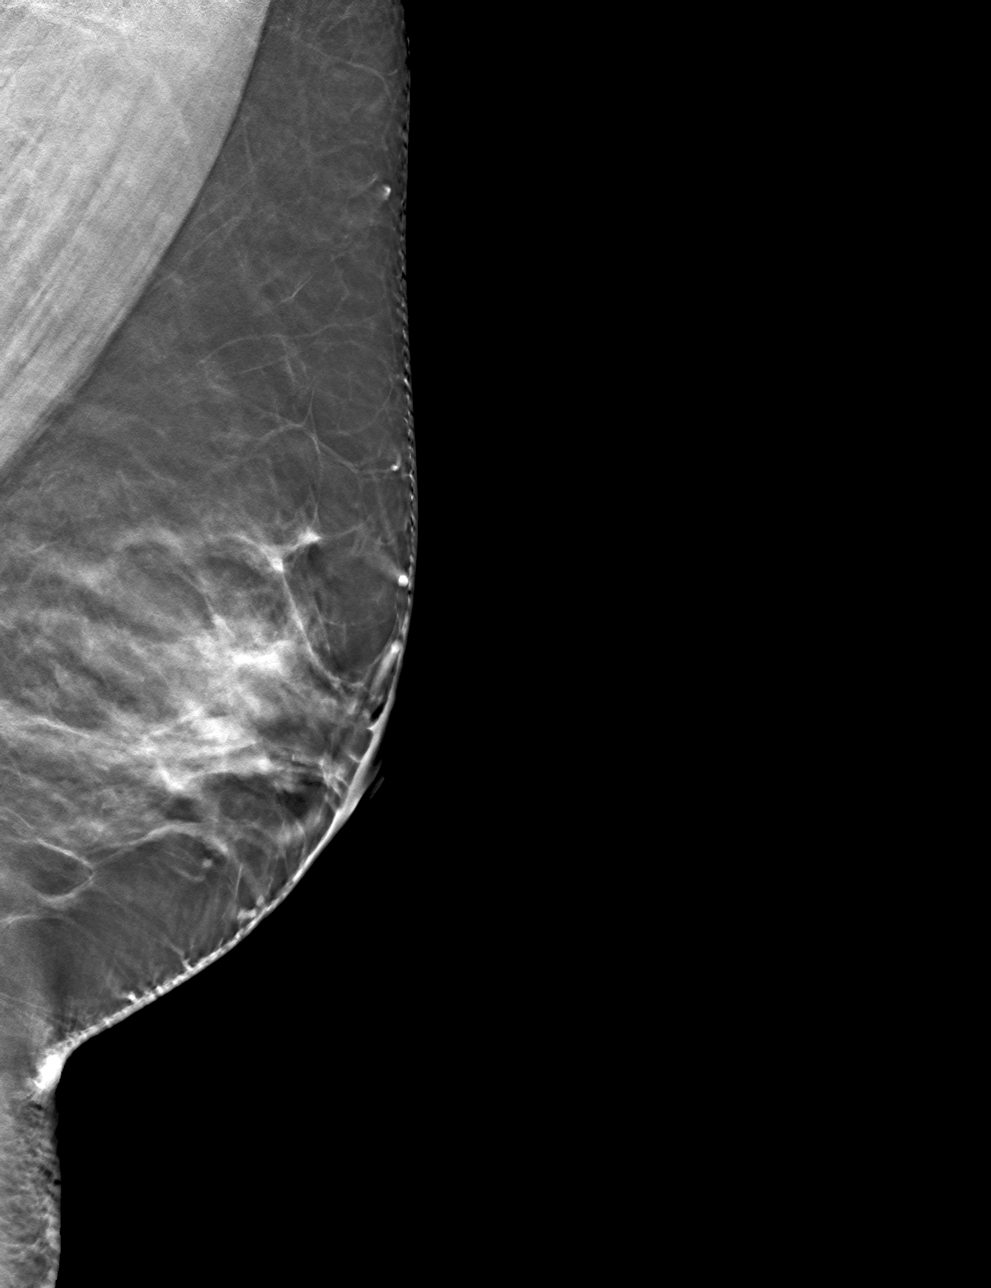

[4 of 12 positions shown; findings below may reference images not displayed]

ACR Breast Density Category c: The breast tissue is heterogeneously
dense, which may obscure small masses.
FINDINGS: No suspicious masses or calcifications are seen in the left breast.
There is no mammographic evidence of malignancy in the left breast.

Mammographic images were processed with CAD.
IMPRESSION: No mammographic evidence of malignancy in the left breast.

RECOMMENDATION:
1. Recommend further management of left breast tenderness be based
on clinical assessment.

2.  Recommend annual routine screening mammography, due June 2021.

I have discussed the findings and recommendations with the patient.
If applicable, a reminder letter will be sent to the patient
regarding the next appointment.

BI-RADS CATEGORY  1: Negative.

## 2021-05-18 ENCOUNTER — Other Ambulatory Visit: Payer: Self-pay | Admitting: *Deleted

## 2021-05-18 ENCOUNTER — Other Ambulatory Visit (HOSPITAL_COMMUNITY): Payer: Self-pay | Admitting: *Deleted

## 2021-05-18 DIAGNOSIS — M25561 Pain in right knee: Secondary | ICD-10-CM

## 2021-05-18 DIAGNOSIS — S8001XA Contusion of right knee, initial encounter: Secondary | ICD-10-CM

## 2021-05-25 ENCOUNTER — Encounter (HOSPITAL_COMMUNITY): Payer: Self-pay

## 2021-05-25 ENCOUNTER — Ambulatory Visit (HOSPITAL_COMMUNITY): Payer: Self-pay

## 2021-06-05 ENCOUNTER — Ambulatory Visit (HOSPITAL_COMMUNITY)
Admission: RE | Admit: 2021-06-05 | Discharge: 2021-06-05 | Disposition: A | Discharge status: Home / Self Care | Payer: Self-pay | Source: Ambulatory Visit | Attending: *Deleted | Admitting: *Deleted

## 2021-06-05 ENCOUNTER — Other Ambulatory Visit (HOSPITAL_COMMUNITY): Payer: Self-pay | Admitting: *Deleted

## 2021-06-05 DIAGNOSIS — M25561 Pain in right knee: Secondary | ICD-10-CM

## 2021-06-05 DIAGNOSIS — S8001XA Contusion of right knee, initial encounter: Secondary | ICD-10-CM | POA: Insufficient documentation

## 2021-07-10 ENCOUNTER — Other Ambulatory Visit (HOSPITAL_COMMUNITY): Payer: Self-pay | Admitting: Nurse Practitioner

## 2021-07-10 DIAGNOSIS — Z1231 Encounter for screening mammogram for malignant neoplasm of breast: Secondary | ICD-10-CM

## 2021-07-27 ENCOUNTER — Ambulatory Visit (HOSPITAL_COMMUNITY): Payer: Self-pay

## 2021-08-10 ENCOUNTER — Ambulatory Visit (HOSPITAL_COMMUNITY)
Admission: RE | Admit: 2021-08-10 | Discharge: 2021-08-10 | Disposition: A | Discharge status: Home / Self Care | Payer: 59 | Source: Ambulatory Visit | Attending: Nurse Practitioner | Admitting: Nurse Practitioner

## 2021-08-10 ENCOUNTER — Other Ambulatory Visit: Payer: Self-pay

## 2021-08-10 DIAGNOSIS — Z1231 Encounter for screening mammogram for malignant neoplasm of breast: Secondary | ICD-10-CM | POA: Insufficient documentation

## 2022-05-14 ENCOUNTER — Inpatient Hospital Stay (HOSPITAL_COMMUNITY)
Admission: EM | Admit: 2022-05-14 | Discharge: 2022-05-17 | DRG: 445 | Disposition: A | Discharge status: Home / Self Care | Payer: 59 | Attending: Internal Medicine | Admitting: Internal Medicine

## 2022-05-14 ENCOUNTER — Emergency Department (HOSPITAL_COMMUNITY): Payer: 59

## 2022-05-14 ENCOUNTER — Encounter (HOSPITAL_COMMUNITY): Payer: Self-pay

## 2022-05-14 ENCOUNTER — Other Ambulatory Visit: Payer: Self-pay

## 2022-05-14 DIAGNOSIS — K759 Inflammatory liver disease, unspecified: Secondary | ICD-10-CM | POA: Diagnosis not present

## 2022-05-14 DIAGNOSIS — R7989 Other specified abnormal findings of blood chemistry: Secondary | ICD-10-CM | POA: Diagnosis present

## 2022-05-14 DIAGNOSIS — K831 Obstruction of bile duct: Secondary | ICD-10-CM

## 2022-05-14 DIAGNOSIS — Z791 Long term (current) use of non-steroidal anti-inflammatories (NSAID): Secondary | ICD-10-CM | POA: Diagnosis not present

## 2022-05-14 DIAGNOSIS — K838 Other specified diseases of biliary tract: Secondary | ICD-10-CM

## 2022-05-14 DIAGNOSIS — K8051 Calculus of bile duct without cholangitis or cholecystitis with obstruction: Principal | ICD-10-CM | POA: Diagnosis present

## 2022-05-14 DIAGNOSIS — K2970 Gastritis, unspecified, without bleeding: Secondary | ICD-10-CM | POA: Diagnosis present

## 2022-05-14 DIAGNOSIS — R932 Abnormal findings on diagnostic imaging of liver and biliary tract: Secondary | ICD-10-CM | POA: Diagnosis not present

## 2022-05-14 DIAGNOSIS — R1013 Epigastric pain: Secondary | ICD-10-CM | POA: Diagnosis not present

## 2022-05-14 DIAGNOSIS — K219 Gastro-esophageal reflux disease without esophagitis: Secondary | ICD-10-CM | POA: Diagnosis not present

## 2022-05-14 DIAGNOSIS — R17 Unspecified jaundice: Secondary | ICD-10-CM | POA: Diagnosis not present

## 2022-05-14 DIAGNOSIS — R1084 Generalized abdominal pain: Principal | ICD-10-CM

## 2022-05-14 DIAGNOSIS — Z79899 Other long term (current) drug therapy: Secondary | ICD-10-CM | POA: Diagnosis not present

## 2022-05-14 DIAGNOSIS — B179 Acute viral hepatitis, unspecified: Secondary | ICD-10-CM | POA: Diagnosis present

## 2022-05-14 DIAGNOSIS — R12 Heartburn: Secondary | ICD-10-CM | POA: Diagnosis not present

## 2022-05-14 DIAGNOSIS — M549 Dorsalgia, unspecified: Secondary | ICD-10-CM | POA: Diagnosis present

## 2022-05-14 DIAGNOSIS — Z9049 Acquired absence of other specified parts of digestive tract: Secondary | ICD-10-CM

## 2022-05-14 DIAGNOSIS — Z833 Family history of diabetes mellitus: Secondary | ICD-10-CM

## 2022-05-14 LAB — COMPREHENSIVE METABOLIC PANEL
ALT: 500 U/L — ABNORMAL HIGH (ref 0–44)
AST: 422 U/L — ABNORMAL HIGH (ref 15–41)
Albumin: 4.5 g/dL (ref 3.5–5.0)
Alkaline Phosphatase: 472 U/L — ABNORMAL HIGH (ref 38–126)
Anion gap: 8 (ref 5–15)
BUN: 9 mg/dL (ref 6–20)
CO2: 23 mmol/L (ref 22–32)
Calcium: 9.5 mg/dL (ref 8.9–10.3)
Chloride: 106 mmol/L (ref 98–111)
Creatinine, Ser: 0.4 mg/dL — ABNORMAL LOW (ref 0.44–1.00)
GFR, Estimated: >60 mL/min (ref >60)
Glucose, Bld: 122 mg/dL — ABNORMAL HIGH (ref 70–99)
Potassium: 3.7 mmol/L (ref 3.5–5.1)
Sodium: 137 mmol/L (ref 135–145)
Total Bilirubin: 3.5 mg/dL — ABNORMAL HIGH (ref 0.3–1.2)
Total Protein: 8.4 g/dL — ABNORMAL HIGH (ref 6.5–8.1)

## 2022-05-14 LAB — URINALYSIS, ROUTINE W REFLEX MICROSCOPIC
Bacteria, UA: NONE SEEN
Glucose, UA: NEGATIVE mg/dL
Ketones, ur: NEGATIVE mg/dL
Leukocytes,Ua: NEGATIVE
Nitrite: NEGATIVE
Protein, ur: NEGATIVE mg/dL
Specific Gravity, Urine: 1.009 (ref 1.005–1.030)
pH: 6 (ref 5.0–8.0)

## 2022-05-14 LAB — CBC
HCT: 40 % (ref 36.0–46.0)
Hemoglobin: 13.7 g/dL (ref 12.0–15.0)
MCH: 32.1 pg (ref 26.0–34.0)
MCHC: 34.3 g/dL (ref 30.0–36.0)
MCV: 93.7 fL (ref 80.0–100.0)
Platelets: 315 10*3/uL (ref 150–400)
RBC: 4.27 MIL/uL (ref 3.87–5.11)
RDW: 12.9 % (ref 11.5–15.5)
WBC: 10.4 10*3/uL (ref 4.0–10.5)
nRBC: 0 % (ref 0.0–0.2)

## 2022-05-14 LAB — POC URINE PREG, ED: Preg Test, Ur: NEGATIVE

## 2022-05-14 LAB — LIPASE, BLOOD: Lipase: 34 U/L (ref 11–51)

## 2022-05-14 LAB — ACETAMINOPHEN LEVEL: Acetaminophen (Tylenol), Serum: <10 ug/mL — ABNORMAL LOW (ref 10–30)

## 2022-05-14 LAB — ETHANOL: Alcohol, Ethyl (B): <10 mg/dL (ref <10)

## 2022-05-14 LAB — AMMONIA: Ammonia: 37 umol/L — ABNORMAL HIGH (ref 9–35)

## 2022-05-14 MED ORDER — ONDANSETRON HCL 4 MG/2ML IJ SOLN: 4.0000 mg | Freq: Four times a day (QID) | INTRAMUSCULAR | Status: DC | PRN

## 2022-05-14 MED ORDER — ACETAMINOPHEN 325 MG PO TABS: 650.0000 mg | ORAL_TABLET | Freq: Four times a day (QID) | ORAL | Status: DC | PRN

## 2022-05-14 MED ORDER — TRAZODONE HCL 50 MG PO TABS
25.0000 mg | ORAL_TABLET | Freq: Every evening | ORAL | Status: DC | PRN
  Administered 2022-05-16: 25 mg via ORAL
  Filled 2022-05-14: qty 1

## 2022-05-14 MED ORDER — POTASSIUM CHLORIDE IN NACL 20-0.9 MEQ/L-% IV SOLN: INTRAVENOUS | Status: DC

## 2022-05-14 MED ORDER — ONDANSETRON HCL 4 MG/2ML IJ SOLN
4.0000 mg | Freq: Once | INTRAMUSCULAR | Status: AC
  Administered 2022-05-14: 4 mg via INTRAVENOUS
  Filled 2022-05-14: qty 2

## 2022-05-14 MED ORDER — ONDANSETRON 4 MG PO TBDP: 8.0000 mg | ORAL_TABLET | Freq: Three times a day (TID) | ORAL | Status: DC | PRN

## 2022-05-14 MED ORDER — ACETAMINOPHEN 650 MG RE SUPP: 650.0000 mg | Freq: Four times a day (QID) | RECTAL | Status: DC | PRN

## 2022-05-14 MED ORDER — HYDROMORPHONE HCL 1 MG/ML IJ SOLN
1.0000 mg | Freq: Once | INTRAMUSCULAR | Status: AC
  Administered 2022-05-14: 1 mg via INTRAVENOUS
  Filled 2022-05-14: qty 1

## 2022-05-14 MED ORDER — ONDANSETRON HCL 4 MG PO TABS: 4.0000 mg | ORAL_TABLET | Freq: Four times a day (QID) | ORAL | Status: DC | PRN

## 2022-05-14 MED ORDER — ENOXAPARIN SODIUM 40 MG/0.4ML IJ SOSY
40.0000 mg | PREFILLED_SYRINGE | INTRAMUSCULAR | Status: DC
  Filled 2022-05-14: qty 0.4

## 2022-05-14 MED ORDER — IOHEXOL 350 MG/ML SOLN
100.0000 mL | Freq: Once | INTRAVENOUS | Status: AC | PRN
  Administered 2022-05-14: 75 mL via INTRAVENOUS

## 2022-05-14 MED ORDER — MAGNESIUM HYDROXIDE 400 MG/5ML PO SUSP: 30.0000 mL | Freq: Every day | ORAL | Status: DC | PRN

## 2022-05-14 MED ORDER — MORPHINE SULFATE (PF) 2 MG/ML IV SOLN
2.0000 mg | INTRAVENOUS | Status: DC | PRN
  Administered 2022-05-15 – 2022-05-16 (×2): 2 mg via INTRAVENOUS
  Filled 2022-05-14 (×3): qty 1

## 2022-05-14 NOTE — ED Provider Notes (Signed)
?[facility] ?Provider Note ? ? ?CSN: [id number] ?Arrival date & time: 05/14/22  1204 ? ?  ? ?History ? ?Chief Complaint  ?Patient presents with  ? Abdominal Pain  ? ? ?Bonnie Fleming is a 51 y.o. female. ? ?51 y/o F with PMH of chronic NSAID use and prior cholecystectomy and oophorectomy presents with epigastric abdominal pain x2 days. History is primarily provided by the daughter at bedside. Pt began experiencing non remitting abdominal pain 2 days ago in the epigastric region. Pain is 10/10. it is nonradiating. Pt experiences minimal relief with PPI. Worse with ingestion of food. Pt notes associated scleral icterus, dark urine, nausea without vomiting, and increased fatigue/tiredness. Denies HA, mental status change, vision change, hearing change. Denies cough, SHOB, palpitations, CP. Denies change in bowel habits. Denies dark or bloody stools. Denies light/clay colored stools. Pt denies recent travel. Denies known contraction of viral hepatitis. Denies use of APAP, ASA, Goody powders. No known family history of biliary disease. No known personal or family history of clotting disorders.  ? ?The history is provided by the patient. No language interpreter was used.  ?Abdominal Pain ?Pain location:  RUQ ? ?  ? ?Home Medications ?Prior to Admission medications   ?Medication Sig Start Date End Date Taking? Authorizing Provider  ?ibuprofen (ADVIL) 800 MG tablet Take 800 mg by mouth as needed.    [provider]  ?ondansetron (ZOFRAN ODT) 8 MG disintegrating tablet Take 1 tablet (8 mg total) by mouth every 8 (eight) hours as needed for nausea or vomiting. 05/18/20   Florian Buff, MD  ?   ? ?Allergies    ?Patient has no known allergies.   ? ?Review of Systems   ?Review of Systems  ?Gastrointestinal:  Positive for abdominal pain.  ?All other systems reviewed and are negative. ? ?Physical Exam ?Updated Vital Signs ?BP 109/70 (BP Location: Right Arm)   Pulse 76   Temp 98.7 ?F (37.1  ?C)   Resp 17   Ht '5\' 6"'$  (1.676 m)   Wt 68.9 kg   SpO2 98%   BMI 24.53 kg/m?  ?Physical Exam ?Vitals and nursing note reviewed.  ?Constitutional:   ?   Appearance: She is well-developed.  ?HENT:  ?   Head: Normocephalic.  ?Eyes:  ?   General: Scleral icterus present.  ?   Extraocular Movements: Extraocular movements intact.  ?Cardiovascular:  ?   Rate and Rhythm: Normal rate and regular rhythm.  ?Pulmonary:  ?   Effort: Pulmonary effort is normal.  ?Abdominal:  ?   General: Abdomen is flat. Bowel sounds are normal. There is no distension.  ?   Palpations: Abdomen is soft.  ?   Tenderness: There is generalized abdominal tenderness and tenderness in the right upper quadrant and epigastric area.  ?Musculoskeletal:     ?   General: Normal range of motion.  ?   Cervical back: Normal range of motion.  ?Skin: ?   General: Skin is warm.  ?Neurological:  ?   Mental Status: She is alert and oriented to person, place, and time.  ? ? ?ED Results / Procedures / Treatments   ?Labs ?(all labs ordered are listed, but only abnormal results are displayed) ?Labs Reviewed  ?COMPREHENSIVE METABOLIC PANEL - Abnormal; Notable for the following components:  ?    Result Value  ? Glucose, Bld 122 (*)   ? Creatinine, Ser 0.40 (*)   ? Total Protein 8.4 (*)   ? AST 422 (*)   ?  ALT 500 (*)   ? Alkaline Phosphatase 472 (*)   ? Total Bilirubin 3.5 (*)   ? All other components within normal limits  ?URINALYSIS, ROUTINE W REFLEX MICROSCOPIC - Abnormal; Notable for the following components:  ? Color, Urine AMBER (*)   ? Hgb urine dipstick SMALL (*)   ? Bilirubin Urine SMALL (*)   ? All other components within normal limits  ?ACETAMINOPHEN LEVEL - Abnormal; Notable for the following components:  ? Acetaminophen (Tylenol), Serum <10 (*)   ? All other components within normal limits  ?POC URINE PREG, ED - Normal  ?LIPASE, BLOOD  ?CBC  ?ETHANOL  ?AMMONIA  ?HEPATITIS PANEL, ACUTE  ? ? ?EKG ?None ? ?Radiology ?CT ABDOMEN PELVIS W CONTRAST ? ?Result  Date: 05/14/2022 ?CLINICAL DATA:  Abdominal pain EXAM: CT ABDOMEN AND PELVIS WITH CONTRAST TECHNIQUE: Multidetector CT imaging of the abdomen and pelvis was performed using the standard protocol following bolus administration of intravenous contrast. RADIATION DOSE REDUCTION: This exam was performed according to the departmental dose-optimization program which includes automated exposure control, adjustment of the mA and/or kV according to patient size and/or use of iterative reconstruction technique. CONTRAST:  95m OMNIPAQUE IOHEXOL 350 MG/ML SOLN COMPARISON:  05/15/2016 FINDINGS: Motion degraded images. Lower chest: Lung bases are essentially clear. Hepatobiliary: Liver is within normal limits. Status post cholecystectomy. Mild intrahepatic and moderate extrahepatic ductal dilatation, new from prior. Common duct measures with 2.0 cm at its midportion. No choledocholithiasis is evident on CT. Pancreas: Within normal limits. Specifically, no obstructing pancreatic head mass or ductal dilatation. Spleen: Within normal limits. Adrenals/Urinary Tract: Adrenal glands are within normal limits. Subcentimeter bilateral renal cysts.  No hydronephrosis. Bladder is within normal limits. Stomach/Bowel: Stomach is within normal limits. No evidence of bowel obstruction. Normal appendix (series 2/image 72). No colonic wall thickening or inflammatory changes. Vascular/Lymphatic: No evidence of abdominal aortic aneurysm. No suspicious abdominopelvic lymphadenopathy. Reproductive: Uterine fibroids. Left ovary is within normal limits.  No right adnexal mass. Other: No abdominopelvic ascites. Musculoskeletal: Visualized osseous structures are within normal limits. IMPRESSION: Status post cholecystectomy. Mild intrahepatic and moderate extrahepatic ductal dilatation, new from prior. Common duct measures 2 cm. No choledocholithiasis or obstructing lesion is evident on CT, favoring distal CBD/ampullary stricture. ERCP is suggested.  Electronically Signed   By: SJulian HyM.D.   On: 05/14/2022 21:38   ? ?Procedures ?Procedures  ? ? ?Medications Ordered in ED ?Medications  ?HYDROmorphone (DILAUDID) injection 1 mg (1 mg Intravenous Given 05/14/22 2103)  ?ondansetron ([facility] injection 4 mg (4 mg Intravenous Given 05/14/22 2102)  ?iohexol (OMNIPAQUE) 350 MG/ML injection 100 mL (75 mLs Intravenous Contrast Given 05/14/22 2113)  ? ? ?ED Course/ Medical Decision Making/ A&P ?  ?                        ?Medical Decision Making ?Pt complains of abdominal pain for the past 2 days  ? ?Problems Addressed: ?Generalized abdominal pain: acute illness or injury ? ?Amount and/or Complexity of Data Reviewed ?Independent Historian:  ?   Details: Daughter here with pt ?Labs: ordered. Decision-making details documented in ED Course. ?   Details: Labs ordered reviewed reported and discussed with patient patient has glucose of 122 AST is 422 ALT is 500 alkaline phos is 472 and patient has an elevated total bilirubin of 3.5. ?Radiology: ordered. ?   Details: CT scan of abdomen shows patient is status postcholecystectomy she has mild intrahepatic and moderate extrahepatic duct dilatation Common  bile duct measures 2 cm no evidence of choledocholithiasis or obstruction lesion. ?Discussion of management or test interpretation with external provider(s): GI doctor work consulted and hospitalist consulted for admission. ?Dr. Gala Romney advised Gi will see in am.  Dr. Laural Golden can do ERCP here.   ? ?Risk ?Prescription drug management. ?Decision regarding hospitalization. ? ? ? ? ? ? ? ? ? ? ?Final Clinical Impression(s) / ED Diagnoses ?Final diagnoses:  ?Generalized abdominal pain  ?Hepatitis  ?Intrahepatic bile duct dilation  ? ? ?Rx / DC Orders ?ED Discharge Orders   ? ? None  ? ?  ? ? ?  ?Fransico Meadow, [place] ?05/14/22 2250 ? ?  ?Lorelle Gibbs, DO ?05/15/22 2106 ? ?

## 2022-05-14 NOTE — ED Notes (Signed)
Patient transported to CT 

## 2022-05-14 NOTE — H&P (Signed)
?  ?  ?[facility] ? ? ?PATIENT NAME: Bonnie Fleming   ? ?MR#:  [id number] ? ?DATE OF BIRTH:  1971-06-28 ? ?DATE OF ADMISSION:  05/14/2022 ? ?PRIMARY CARE PHYSICIAN: Royce Macadamia D., PA-C  ? ?Patient is coming from: Home ? ?REQUESTING/REFERRING PHYSICIAN: Fransico Meadow, PA-C  ? ?CHIEF COMPLAINT:  ? ?Chief Complaint  ?Patient presents with  ?? Abdominal Pain  ? ? ?HISTORY OF PRESENT ILLNESS:  ?Bonnie Fleming is a 51 y.o. female with medical history significant for suspected GERD, who presented to the emergency room with a [place] of epigastric abdominal pain with associated nausea without vomiting since 2 PM yesterday.  She admits to chills but did not have any reported fever.  She was noted to have mildly icteric sclera.  No dysuria, oliguria or hematuria or flank pain.  No cough or wheezing or dyspnea.  No chest pain or palpitations. ? ?ED Course: Upon presenting to the emergency room, vital signs were within normal.  Labs revealed Elevated LFTs with alk phos of [birth year]1971 and AST 422 and ALT 500 2 PM 8.4 and albumin 4.5 with total bilirubin of 3.5.  Ammonia level was 37 and lipase 34.  CBC was within normal.  Tylenol level was less than 10 and urine pregnancy test was negative.  You a was unremarkable and alcohol level was less than 10. ? ?Imaging: CT of the abdomen and pelvis revealed her cholecystectomy and mild intrahepatic and moderate extrahepatic ductal dilatation that is new.  Common bile duct measured 2 cm.  There was no choledocholithiasis or obstructing lesions evident on CT.  It favored distal common bile duct/ampullary stricture with recommendation for ERCP. ? ?Contact was made with Dr. Gala Romney who indicated Dr. Laural Golden will be here tomorrow for ERCP.  The patient was given 1 mg of IV Dilaudid and 4 mg of IV Zofran.  She will be admitted to a medical bed for further evaluation and management. ?PAST MEDICAL HISTORY:  ?  Suspected GERD. ? ?PAST SURGICAL HISTORY:  ? ?Past Surgical  History:  ?Procedure Laterality Date  ?? CHOLECYSTECTOMY    ?? LAPAROSCOPIC UNILATERAL SALPINGO OOPHERECTOMY Right 05/18/2020  ? Procedure: LAPAROSCOPIC RIGHT SALPINGO OOPHORECTOMY;  Surgeon: Florian Buff, MD;  Location: [facility];  Service: Gynecology;  Laterality: Right;  ? ? ?SOCIAL HISTORY:  ? ?Social History  ? ?Tobacco Use  ?? Smoking status: Never  ?? Smokeless tobacco: Never  ?Substance Use Topics  ?? Alcohol use: No  ? ? ?FAMILY HISTORY:  ? ?Family History  ?Problem Relation Age of Onset  ?? Diabetes Father   ? ? ?DRUG ALLERGIES:  ?No Known Allergies ? ?REVIEW OF SYSTEMS:  ? ?ROS ?As per history of present illness. All pertinent systems were reviewed above. Constitutional, HEENT, cardiovascular, respiratory, GI, GU, musculoskeletal, neuro, psychiatric, endocrine, integumentary and hematologic systems were reviewed and are otherwise negative/unremarkable except for positive findings mentioned above in the HPI. ? ? ?MEDICATIONS AT HOME:  ? ?Prior to Admission medications   ?Medication Sig Start Date End Date Taking? Authorizing Provider  ?ibuprofen (ADVIL) 800 MG tablet Take 800 mg by mouth as needed.    [provider]  ?ondansetron (ZOFRAN ODT) 8 MG disintegrating tablet Take 1 tablet (8 mg total) by mouth every 8 (eight) hours as needed for nausea or vomiting. 05/18/20   Florian Buff, MD  ? ?  ? ?VITAL SIGNS:  ?Blood pressure 101/61, pulse 90, temperature 98.7 ?F (37.1 ?C), temperature source Oral, resp. rate 18, height 5'  2" (1.575 m), weight 67.3 kg, SpO2 98 %. ? ?PHYSICAL EXAMINATION:  ?Physical Exam ? ?GENERAL:  51 y.o.-year-old Hispanic female patient lying in the bed with no acute distress.  ?EYES: Pupils equal, round, reactive to light and accommodation. No scleral icterus. Extraocular muscles intact.  ?HEENT: Head atraumatic, normocephalic. Oropharynx and nasopharynx clear.  ?NECK:  Supple, no jugular venous distention. No thyroid enlargement, no tenderness.  ?LUNGS: Normal breath sounds  bilaterally, no wheezing, rales,rhonchi or crepitation. No use of accessory muscles of respiration.  ?CARDIOVASCULAR: Regular rate and rhythm, S1, S2 normal. No murmurs, rubs, or gallops.  ?ABDOMEN: Soft, nondistended, with mild epigastric and right upper quadrant tenderness without rebound tenderness guarding or rigidity.. Bowel sounds present. No organomegaly or mass.  ?EXTREMITIES: No pedal edema, cyanosis, or clubbing.  ?NEUROLOGIC: Cranial nerves II through XII are intact. Muscle strength 5/5 in all extremities. Sensation intact. Gait not checked.  ?PSYCHIATRIC: The patient is alert and oriented x 3.  Normal affect and good eye contact. ?SKIN: No obvious rash, lesion, or ulcer.  ? ?LABORATORY PANEL:  ? ?CBC ?Recent Labs  ?Lab 05/14/22 ?1311  ?WBC 10.4  ?HGB 13.7  ?HCT 40.0  ?PLT 315  ? ?------------------------------------------------------------------------------------------------------------------ ? ?Chemistries  ?Recent Labs  ?Lab 05/14/22 ?1311  ?NA 137  ?K 3.7  ?CL 106  ?CO2 23  ?GLUCOSE 122*  ?BUN 9  ?CREATININE 0.40*  ?CALCIUM 9.5  ?AST 422*  ?ALT 500*  ?ALKPHOS 472*  ?BILITOT 3.5*  ? ?------------------------------------------------------------------------------------------------------------------ ? ?Cardiac Enzymes ?No results for input(s): TROPONINI in the last 168 hours. ?------------------------------------------------------------------------------------------------------------------ ? ?RADIOLOGY:  ?CT ABDOMEN PELVIS W CONTRAST ? ?Result Date: 05/14/2022 ?CLINICAL DATA:  Abdominal pain EXAM: CT ABDOMEN AND PELVIS WITH CONTRAST TECHNIQUE: Multidetector CT imaging of the abdomen and pelvis was performed using the standard protocol following bolus administration of intravenous contrast. RADIATION DOSE REDUCTION: This exam was performed according to the departmental dose-optimization program which includes automated exposure control, adjustment of the mA and/or kV according to patient size and/or use of  iterative reconstruction technique. CONTRAST:  25m OMNIPAQUE IOHEXOL 350 MG/ML SOLN COMPARISON:  05/15/2016 FINDINGS: Motion degraded images. Lower chest: Lung bases are essentially clear. Hepatobiliary: Liver is within normal limits. Status post cholecystectomy. Mild intrahepatic and moderate extrahepatic ductal dilatation, new from prior. Common duct measures with 2.0 cm at its midportion. No choledocholithiasis is evident on CT. Pancreas: Within normal limits. Specifically, no obstructing pancreatic head mass or ductal dilatation. Spleen: Within normal limits. Adrenals/Urinary Tract: Adrenal glands are within normal limits. Subcentimeter bilateral renal cysts.  No hydronephrosis. Bladder is within normal limits. Stomach/Bowel: Stomach is within normal limits. No evidence of bowel obstruction. Normal appendix (series 2/image 72). No colonic wall thickening or inflammatory changes. Vascular/Lymphatic: No evidence of abdominal aortic aneurysm. No suspicious abdominopelvic lymphadenopathy. Reproductive: Uterine fibroids. Left ovary is within normal limits.  No right adnexal mass. Other: No abdominopelvic ascites. Musculoskeletal: Visualized osseous structures are within normal limits. IMPRESSION: Status post cholecystectomy. Mild intrahepatic and moderate extrahepatic ductal dilatation, new from prior. Common duct measures 2 cm. No choledocholithiasis or obstructing lesion is evident on CT, favoring distal CBD/ampullary stricture. ERCP is suggested. Electronically Signed   By: SJulian HyM.D.   On: 05/14/2022 21:38   ? ? ? ?IMPRESSION AND PLAN:  ?Assessment and Plan: ?* Obstructive jaundice ?- The patient will be admitted to a medical bed. ?- This could be possibly related to distal common bile duct obstruction or ampullary stricture. ?- Pain management will be provided. ?- GI consultation  will be obtained. ?- Dr. Gala Romney was notified about the patient and indicated Dr. Jearld Adjutant will be available for ERCP here  at [facility] in AM. ?- The patient will be kept n.p.o. after midnight. ?- We will follow LFTs. ? ?Elevated LFTs ?- This is associated with acute hepatitis. ?- We will follow LFTs with hydration. ?- We will check vira

## 2022-05-14 NOTE — ED Triage Notes (Signed)
PT c/o Abd pain since yesterday. Pt went to a dr last week and they gave her protonix.  ?

## 2022-05-15 ENCOUNTER — Encounter (HOSPITAL_COMMUNITY): Payer: Self-pay | Admitting: Family Medicine

## 2022-05-15 DIAGNOSIS — K831 Obstruction of bile duct: Secondary | ICD-10-CM | POA: Diagnosis not present

## 2022-05-15 DIAGNOSIS — R7989 Other specified abnormal findings of blood chemistry: Secondary | ICD-10-CM | POA: Diagnosis not present

## 2022-05-15 DIAGNOSIS — R1013 Epigastric pain: Secondary | ICD-10-CM

## 2022-05-15 DIAGNOSIS — K838 Other specified diseases of biliary tract: Secondary | ICD-10-CM | POA: Diagnosis not present

## 2022-05-15 DIAGNOSIS — K219 Gastro-esophageal reflux disease without esophagitis: Secondary | ICD-10-CM

## 2022-05-15 LAB — CBC
HCT: 36.2 % (ref 36.0–46.0)
Hemoglobin: 11.8 g/dL — ABNORMAL LOW (ref 12.0–15.0)
MCH: 30.5 pg (ref 26.0–34.0)
MCHC: 32.6 g/dL (ref 30.0–36.0)
MCV: 93.5 fL (ref 80.0–100.0)
Platelets: 258 10*3/uL (ref 150–400)
RBC: 3.87 MIL/uL (ref 3.87–5.11)
RDW: 13.1 % (ref 11.5–15.5)
WBC: 8.3 10*3/uL (ref 4.0–10.5)
nRBC: 0 % (ref 0.0–0.2)

## 2022-05-15 LAB — COMPREHENSIVE METABOLIC PANEL
ALT: 471 U/L — ABNORMAL HIGH (ref 0–44)
AST: 285 U/L — ABNORMAL HIGH (ref 15–41)
Albumin: 4 g/dL (ref 3.5–5.0)
Alkaline Phosphatase: 450 U/L — ABNORMAL HIGH (ref 38–126)
Anion gap: 7 (ref 5–15)
BUN: 6 mg/dL (ref 6–20)
CO2: 21 mmol/L — ABNORMAL LOW (ref 22–32)
Calcium: 9 mg/dL (ref 8.9–10.3)
Chloride: 110 mmol/L (ref 98–111)
Creatinine, Ser: 0.34 mg/dL — ABNORMAL LOW (ref 0.44–1.00)
GFR, Estimated: >60 mL/min (ref >60)
Glucose, Bld: 114 mg/dL — ABNORMAL HIGH (ref 70–99)
Potassium: 3.5 mmol/L (ref 3.5–5.1)
Sodium: 138 mmol/L (ref 135–145)
Total Bilirubin: 5.5 mg/dL — ABNORMAL HIGH (ref 0.3–1.2)
Total Protein: 7 g/dL (ref 6.5–8.1)

## 2022-05-15 LAB — HEPATITIS PANEL, ACUTE
HCV Ab: NONREACTIVE
Hep A IgM: NONREACTIVE
Hep B C IgM: NONREACTIVE
Hepatitis B Surface Ag: NONREACTIVE

## 2022-05-15 LAB — HIV ANTIBODY (ROUTINE TESTING W REFLEX): HIV Screen 4th Generation wRfx: NONREACTIVE

## 2022-05-15 MED ORDER — PANTOPRAZOLE SODIUM 40 MG PO TBEC
40.0000 mg | DELAYED_RELEASE_TABLET | Freq: Two times a day (BID) | ORAL | Status: DC
  Administered 2022-05-15 – 2022-05-17 (×3): 40 mg via ORAL
  Filled 2022-05-15 (×3): qty 1

## 2022-05-15 NOTE — Assessment & Plan Note (Addendum)
-   This is associated with acute hepatitis. ?- We will follow LFTs with hydration. ?- We will check viral hepatitis markers. ?

## 2022-05-15 NOTE — TOC Progression Note (Signed)
?  Transition of Care (TOC) Screening Note ? ? ?Patient Details  ?Name: Bonnie Fleming ?Date of Birth: 1971-01-05 ? ? ?Transition of Care (TOC) CM/SW Contact:    ?Boneta Lucks, RN ?Phone Number: ?05/15/2022, 12:00 PM ? ?ERCP today  ? ?Transition of Care Department [facility]) has reviewed patient and no TOC needs have been identified at this time. We will continue to monitor patient advancement through interdisciplinary progression rounds. If new patient transition needs arise, please place a TOC consult. ? ? ? ?  ?Barriers to Discharge: Continued Medical Work up ? ? ?  ?

## 2022-05-15 NOTE — Consult Note (Addendum)
'@LOGO' @ ? ? ?Referring Provider: Dr. Roderic Palau ?Primary Care Physician:  Raiford Simmonds., PA-C ?Primary Gastroenterologist:  Dr. Laural Golden (previously unassigned) ? ?Date of Admission: 05/14/22  ?Date of Consultation: 05/15/22 ? ?Reason for Consultation:  Jaundice ? ?HPI:  ?Bonnie Fleming is a 51 y.o. year old female with no significant past medical history who presented to the emergency room on 5/15 with complaint of epigastric pain, nausea without vomiting since 2 PM on 5/14.  Also noticed mild scleral icterus. ? ?ED course: ?Vital signs stable. ? ?Labs remarkable for LFTs elevated (AST 422, ALT 500, alk phos 472, total bilirubin 3.5).  CBC and lipase within normal limits.  UA negative.  UPT negative.  Alcohol level less than 10.  Acetaminophen level less than 10.  Ammonia slightly elevated at 37.  Acute hepatitis panel ordered and in process. ? ?CT A/P with contrast with normal-appearing liver, s/p cholecystectomy, mild intrahepatic and moderate extrahepatic ductal dilation, CBD measuring 2.0 cm at its midpoint, no choledocholithiasis or obstructing lesion evident favoring distal CBD/ampullary stricture with recommendations for ERCP.  Pancreas appears normal. ? ?She was started on IV fluids.  ? ?Repeat labs this morning with AST 285, ALT 471, alk phos 450, total bilirubin 5.5.  Hemoglobin down to 11.8. ? ?Consult:  ?Sunday around 2PM, she started having epigastric abdominal pain, 8/10, described as intense dull pain.  She actually ports she has had mild intermittent abdominal pain starting around March, but it was very minimal.  Last Monday, she started noticing increased frequency and increased severity with pain lasting for hours and often postprandial, worsened by spicy foods or greasy foods.  On Sunday, when her pain started around 2 PM, it was persistent which is what caused her to present to the emergency room.  She had associated nausea without vomiting.  She did have some pain radiating up into her  chest, but this has resolved.  Denies radiation to her back. ? ?Had similar abdominal pain back in 2017 and was given medications to help with pain and sent home. Lfts were elevated at that time as well.   ? ?No Hx of GERD. No dysphagia.  ? ?Alcohol: No ?Illicit drug use: No ?Tylenol: No ?Ibuprofen: 2-3 times a week for back pain but has been taking more frequently for abdominal pain.  ?OTC supplements/herbal teas: None.  ?New medication/recent antibiotic use: Went to doctor lats week and was started on pantoprazole 40 mg daily for her abdominal pain. No other new medications/antibiotics.  ?No Fhx of liver disease or autoimmune conditions.  ? ?No mental status changes/confusion.  ? ?No brbpr or melena. No prior EGD or colonoscopy.  ? ?History reviewed. No pertinent past medical history. ? ?Past Surgical History:  ?Procedure Laterality Date  ? CHOLECYSTECTOMY    ? LAPAROSCOPIC UNILATERAL SALPINGO OOPHERECTOMY Right 05/18/2020  ? Procedure: LAPAROSCOPIC RIGHT SALPINGO OOPHORECTOMY;  Surgeon: Florian Buff, MD;  Location: [facility];  Service: Gynecology;  Laterality: Right;  ? ? ?Prior to Admission medications   ?Medication Sig Start Date End Date Taking? Authorizing Provider  ?ibuprofen (ADVIL) 800 MG tablet Take 800 mg by mouth as needed.    [provider]  ?ondansetron (ZOFRAN ODT) 8 MG disintegrating tablet Take 1 tablet (8 mg total) by mouth every 8 (eight) hours as needed for nausea or vomiting. 05/18/20   Florian Buff, MD  ? ? ?Current Facility-Administered Medications  ?Medication Dose Route Frequency Provider Last Rate Last Admin  ? 0.9 % NaCl with KCl 20 mEq/ L  infusion   Intravenous Continuous Mansy, Jan A, MD 100 mL/hr at 05/15/22 0111 New Bag at 05/15/22 0111  ? acetaminophen (TYLENOL) tablet 650 mg  650 mg Oral Q6H PRN Mansy, Jan A, MD      ? Or  ? acetaminophen (TYLENOL) suppository 650 mg  650 mg Rectal Q6H PRN Mansy, Jan A, MD      ? enoxaparin (LOVENOX) injection 40 mg  40 mg Subcutaneous  Q24H Mansy, Jan A, MD      ? magnesium hydroxide (MILK OF MAGNESIA) suspension 30 mL  30 mL Oral Daily PRN Mansy, Jan A, MD      ? morphine (PF) 2 MG/ML injection 2 mg  2 mg Intravenous Q4H PRN Mansy, Jan A, MD      ? ondansetron [facility]) tablet 4 mg  4 mg Oral Q6H PRN Mansy, Jan A, MD      ? Or  ? ondansetron [facility]) injection 4 mg  4 mg Intravenous Q6H PRN Mansy, Jan A, MD      ? ondansetron (ZOFRAN-ODT) disintegrating tablet 8 mg  8 mg Oral Q8H PRN Mansy, Jan A, MD      ? traZODone (DESYREL) tablet 25 mg  25 mg Oral QHS PRN Mansy, Arvella Merles, MD      ? ? ?Allergies as of 05/14/2022  ? (No Known Allergies)  ? ? ?Family History  ?Problem Relation Age of Onset  ? Diabetes Father   ? ? ?Social History  ? ?Socioeconomic History  ? Marital status: Divorced  ?  Spouse name: Not on file  ? Number of children: Not on file  ? Years of education: Not on file  ? Highest education level: Not on file  ?Occupational History  ? Not on file  ?Tobacco Use  ? Smoking status: Never  ? Smokeless tobacco: Never  ?Vaping Use  ? Vaping Use: Never used  ?Substance and Sexual Activity  ? Alcohol use: No  ? Drug use: No  ? Sexual activity: Yes  ?  Birth control/protection: Surgical  ?Other Topics Concern  ? Not on file  ?Social History Narrative  ? Not on file  ? ?Social Determinants of Health  ? ?Financial Resource Strain: Not on file  ?Food Insecurity: Not on file  ?Transportation Needs: Not on file  ?Physical Activity: Not on file  ?Stress: Not on file  ?Social Connections: Not on file  ?Intimate Partner Violence: Not on file  ? ? ?Review of Systems: ?Gen: Denies fever, chills, loss of appetite, change in weight or weight loss ?CV: Denies chest pain, heart palpitations. ?Resp: Denies shortness of breath or cough.  ?GI: See HPI ?GU : Denies urinary burning, urinary frequency, urinary incontinence.  ?MS: Admits to upper back pain.  ?Derm: Denies rash. ?Psych: Denies depression, anxiety. ?Heme: See HPI ? ?Physical Exam: ?Vital signs in last 24  hours: ?Temp:  [98.1 ?F (36.7 ?C)-98.7 ?F (37.1 ?C)] 98.7 ?F (37.1 ?C) (05/16 0439) ?Pulse Rate:  [73-90] 90 (05/16 0439) ?Resp:  [16-18] 18 (05/16 0439) ?BP: (101-140)/(61-77) 101/61 (05/16 0439) ?SpO2:  [98 %-100 %] 98 % (05/16 0439) ?Weight:  [67.3 kg-68.9 kg] 67.3 kg (05/15 2322) ?Last BM Date : 05/15/22 ?General:   Alert,  Well-developed, well-nourished, pleasant and cooperative in NAD ?Head:  Normocephalic and atraumatic. ?Eyes:  + scleral icterus.  ?Ears:  Normal auditory acuity. ?Nose:  No deformity, discharge,  or lesions. ?Lungs:  Clear throughout to auscultation.   No wheezes, crackles, or rhonchi. No acute distress. ?Heart:  Regular rate and rhythm. Systolic murmur appreciated.  ?Abdomen:  Soft, nontender and nondistended. No masses, hepatosplenomegaly or hernias noted. Normal bowel sounds, without guarding, and without rebound.   ?Msk:  Symmetrical without gross deformities. Normal posture. ?Extremities:  Without edema. ?Neurologic:  Alert and  oriented x4;  grossly normal neurologically. ?Skin:  Intact without significant lesions or rashes. ?Psych: Normal mood and affect. ? ?Intake/Output from previous day: ?No intake/output data recorded. ?Intake/Output this shift: ?No intake/output data recorded. ? ?Lab Results: ?Recent Labs  ?  05/14/22 ?1311 05/15/22 ?4299  ?WBC 10.4 8.3  ?HGB 13.7 11.8*  ?HCT 40.0 36.2  ?PLT 315 258  ? ?BMET ?Recent Labs  ?  05/14/22 ?1311 05/15/22 ?8069  ?NA 137 138  ?K 3.7 3.5  ?CL 106 110  ?CO2 23 21*  ?GLUCOSE 122* 114*  ?BUN 9 6  ?CREATININE 0.40* 0.34*  ?CALCIUM 9.5 9.0  ? ?LFT ?Recent Labs  ?  05/14/22 ?1311 05/15/22 ?9967  ?PROT 8.4* 7.0  ?ALBUMIN 4.5 4.0  ?AST 422* 285*  ?ALT 500* 471*  ?ALKPHOS 472* 450*  ?BILITOT 3.5* 5.5*  ? ? ?Studies/Results: ?CT ABDOMEN PELVIS W CONTRAST ? ?Result Date: 05/14/2022 ?CLINICAL DATA:  Abdominal pain EXAM: CT ABDOMEN AND PELVIS WITH CONTRAST TECHNIQUE: Multidetector CT imaging of the abdomen and pelvis was performed using the standard  protocol following bolus administration of intravenous contrast. RADIATION DOSE REDUCTION: This exam was performed according to the departmental dose-optimization program which includes automated exposure contr

## 2022-05-15 NOTE — Progress Notes (Signed)
?PROGRESS NOTE ? ? ? ?Bonnie Fleming  FXT:[id number] DOB: August 25, 1971 DOA: 05/14/2022 ?PCP: Raiford Simmonds., PA-C  ? ? ?Brief Narrative:  ?51 year old female with a history of GERD, admitted to the hospital with epigastric abdominal pain and nausea.  Noted to have elevated liver enzymes as well as hyperbilirubinemia.  Imaging indicated dilated biliary ducts.  GI following.  Plans are for ERCP on 5/17. ? ? ?Assessment & Plan: ?  ?Principal Problem: ?  Obstructive jaundice ?Active Problems: ?  GERD (gastroesophageal reflux disease) ?  Elevated LFTs ?  Epigastric pain ?  Dilated cbd, acquired ?  Intrahepatic bile duct dilation ? ? ?Elevated LFTs with obstructive jaundice ?-Noted on imaging to have dilated common bile duct ?-She is already status post cholecystectomy ?-GI following ?-Plans are for ERCP on 5/17 ? ?Epigastric abdominal pain ?-She does have a history of NSAID use ?-Continue on PPI ?-We will also have EGD on 5/17 ? ? ?DVT prophylaxis: enoxaparin (LOVENOX) injection 40 mg Start: 05/15/22 1000 ? ?Code Status: Full code ?Family Communication: Discussed with daughter at the bedside ?Disposition Plan: Status is: Inpatient ?Remains inpatient appropriate because: Likely discharge home once jaundice has been addressed ? ? ? ? ?Consultants:  ?Gastroenterology ? ?Procedures:  ? ? ?Antimicrobials:  ?  ? ? ?Subjective: ?She feels that abdominal pain has improved since admission with pain medications.  No vomiting. ? ?Objective: ?Vitals:  ? 05/14/22 2105 05/14/22 2322 05/15/22 0439 05/15/22 1452  ?BP: 109/70 111/77 101/61 115/76  ?Pulse: 76 89 90 79  ?Resp: '17 17 18 17  '$ ?Temp:  98.1 ?F (36.7 ?C) 98.7 ?F (37.1 ?C) 99.6 ?F (37.6 ?C)  ?TempSrc:  Oral Oral Oral  ?SpO2: 98% 99% 98% 100%  ?Weight:  67.3 kg    ?Height:  '5\' 2"'$  (1.575 m)    ? ? ?Intake/Output Summary (Last 24 hours) at 05/15/2022 2001 ?Last data filed at 05/15/2022 1200 ?Gross per 24 hour  ?Intake 480 ml  ?Output --  ?Net 480 ml  ? ?Filed Weights  ?  05/14/22 1233 05/14/22 2322  ?Weight: 68.9 kg 67.3 kg  ? ? ?Examination: ? ?General exam: Appears calm and comfortable  ?Respiratory system: Clear to auscultation. Respiratory effort normal. ?Cardiovascular system: S1 & S2 heard, RRR. No JVD, murmurs, rubs, gallops or clicks. No pedal edema. ?Gastrointestinal system: Abdomen is nondistended, soft and nontender. No organomegaly or masses felt. Normal bowel sounds heard. ?Central nervous system: Alert and oriented. No focal neurological deficits. ?Extremities: Symmetric 5 x 5 power. ?Skin: No rashes, lesions or ulcers ?Psychiatry: Judgement and insight appear normal. Mood & affect appropriate.  ? ? ? ?Data Reviewed: I have personally reviewed following labs and imaging studies ? ?CBC: ?Recent Labs  ?Lab 05/14/22 ?1311 05/15/22 ?2992  ?WBC 10.4 8.3  ?HGB 13.7 11.8*  ?HCT 40.0 36.2  ?MCV 93.7 93.5  ?PLT 315 258  ? ?Basic Metabolic Panel: ?Recent Labs  ?Lab 05/14/22 ?1311 05/15/22 ?4268  ?NA 137 138  ?K 3.7 3.5  ?CL 106 110  ?CO2 23 21*  ?GLUCOSE 122* 114*  ?BUN 9 6  ?CREATININE 0.40* 0.34*  ?CALCIUM 9.5 9.0  ? ?GFR: ?Estimated Creatinine Clearance: 77.4 mL/min (A) (by C-G formula based on SCr of 0.34 mg/dL (L)). ?Liver Function Tests: ?Recent Labs  ?Lab 05/14/22 ?1311 05/15/22 ?3419  ?AST 422* 285*  ?ALT 500* 471*  ?ALKPHOS 472* 450*  ?BILITOT 3.5* 5.5*  ?PROT 8.4* 7.0  ?ALBUMIN 4.5 4.0  ? ?Recent Labs  ?Lab 05/14/22 ?1311  ?LIPASE 34  ? ?  Recent Labs  ?Lab 05/14/22 ?2127  ?AMMONIA 37*  ? ?Coagulation Profile: ?No results for input(s): INR, PROTIME in the last 168 hours. ?Cardiac Enzymes: ?No results for input(s): CKTOTAL, CKMB, CKMBINDEX, TROPONINI in the last 168 hours. ?BNP (last 3 results) ?No results for input(s): PROBNP in the last 8760 hours. ?HbA1C: ?No results for input(s): HGBA1C in the last 72 hours. ?CBG: ?No results for input(s): GLUCAP in the last 168 hours. ?Lipid Profile: ?No results for input(s): CHOL, HDL, LDLCALC, TRIG, CHOLHDL, LDLDIRECT in the last  72 hours. ?Thyroid Function Tests: ?No results for input(s): TSH, T4TOTAL, FREET4, T3FREE, THYROIDAB in the last 72 hours. ?Anemia Panel: ?No results for input(s): VITAMINB12, FOLATE, FERRITIN, TIBC, IRON, RETICCTPCT in the last 72 hours. ?Sepsis Labs: ?No results for input(s): PROCALCITON, LATICACIDVEN in the last 168 hours. ? ?No results found for this or any previous visit (from the past 240 hour(s)).  ? ? ? ? ? ?Radiology Studies: ?CT ABDOMEN PELVIS W CONTRAST ? ?Result Date: 05/14/2022 ?CLINICAL DATA:  Abdominal pain EXAM: CT ABDOMEN AND PELVIS WITH CONTRAST TECHNIQUE: Multidetector CT imaging of the abdomen and pelvis was performed using the standard protocol following bolus administration of intravenous contrast. RADIATION DOSE REDUCTION: This exam was performed according to the departmental dose-optimization program which includes automated exposure control, adjustment of the mA and/or kV according to patient size and/or use of iterative reconstruction technique. CONTRAST:  56m OMNIPAQUE IOHEXOL 350 MG/ML SOLN COMPARISON:  05/15/2016 FINDINGS: Motion degraded images. Lower chest: Lung bases are essentially clear. Hepatobiliary: Liver is within normal limits. Status post cholecystectomy. Mild intrahepatic and moderate extrahepatic ductal dilatation, new from prior. Common duct measures with 2.0 cm at its midportion. No choledocholithiasis is evident on CT. Pancreas: Within normal limits. Specifically, no obstructing pancreatic head mass or ductal dilatation. Spleen: Within normal limits. Adrenals/Urinary Tract: Adrenal glands are within normal limits. Subcentimeter bilateral renal cysts.  No hydronephrosis. Bladder is within normal limits. Stomach/Bowel: Stomach is within normal limits. No evidence of bowel obstruction. Normal appendix (series 2/image 72). No colonic wall thickening or inflammatory changes. Vascular/Lymphatic: No evidence of abdominal aortic aneurysm. No suspicious abdominopelvic  lymphadenopathy. Reproductive: Uterine fibroids. Left ovary is within normal limits.  No right adnexal mass. Other: No abdominopelvic ascites. Musculoskeletal: Visualized osseous structures are within normal limits. IMPRESSION: Status post cholecystectomy. Mild intrahepatic and moderate extrahepatic ductal dilatation, new from prior. Common duct measures 2 cm. No choledocholithiasis or obstructing lesion is evident on CT, favoring distal CBD/ampullary stricture. ERCP is suggested. Electronically Signed   By: SJulian HyM.D.   On: 05/14/2022 21:38   ? ? ? ? ? ?Scheduled Meds: ? enoxaparin (LOVENOX) injection  40 mg Subcutaneous Q24H  ? pantoprazole  40 mg Oral BID  ? ?Continuous Infusions: ? 0.9 % NaCl with KCl 20 mEq / L 100 mL/hr at 05/15/22 1105  ? ? ? LOS: 1 day  ? ? ?Time spent: 382ms ? ? ? ?JeKathie DikeMD ?[facility] ? ? ?If 7PM-7AM, please contact night-coverage ?[email or web address] ? ?05/15/2022, 8:01 PM  ? ?

## 2022-05-15 NOTE — Assessment & Plan Note (Signed)
-   The patient will be admitted to a medical bed. ?- This could be possibly related to distal common bile duct obstruction or ampullary stricture. ?- Pain management will be provided. ?- GI consultation will be obtained. ?- Dr. Gala Romney was notified about the patient and indicated Dr. Jearld Adjutant will be available for ERCP here at [facility] in AM. ?- The patient will be kept n.p.o. after midnight. ?- We will follow LFTs. ?

## 2022-05-15 NOTE — Assessment & Plan Note (Signed)
-   We will continue PPI therapy 

## 2022-05-16 ENCOUNTER — Inpatient Hospital Stay (HOSPITAL_COMMUNITY): Payer: 59 | Admitting: Anesthesiology

## 2022-05-16 ENCOUNTER — Inpatient Hospital Stay (HOSPITAL_COMMUNITY): Payer: 59

## 2022-05-16 ENCOUNTER — Encounter (HOSPITAL_COMMUNITY)
Admission: EM | Disposition: A | Discharge status: Home / Self Care | Payer: Self-pay | Source: Home / Self Care | Attending: Internal Medicine

## 2022-05-16 ENCOUNTER — Encounter (HOSPITAL_COMMUNITY): Payer: Self-pay | Admitting: Family Medicine

## 2022-05-16 DIAGNOSIS — K2970 Gastritis, unspecified, without bleeding: Secondary | ICD-10-CM

## 2022-05-16 DIAGNOSIS — K8051 Calculus of bile duct without cholangitis or cholecystitis with obstruction: Secondary | ICD-10-CM

## 2022-05-16 DIAGNOSIS — K831 Obstruction of bile duct: Secondary | ICD-10-CM | POA: Diagnosis not present

## 2022-05-16 DIAGNOSIS — R17 Unspecified jaundice: Secondary | ICD-10-CM

## 2022-05-16 DIAGNOSIS — R932 Abnormal findings on diagnostic imaging of liver and biliary tract: Secondary | ICD-10-CM

## 2022-05-16 DIAGNOSIS — R1013 Epigastric pain: Secondary | ICD-10-CM

## 2022-05-16 DIAGNOSIS — R12 Heartburn: Secondary | ICD-10-CM

## 2022-05-16 HISTORY — PX: ERCP: SHX5425

## 2022-05-16 HISTORY — PX: BILIARY STENT PLACEMENT: SHX5538

## 2022-05-16 HISTORY — PX: ESOPHAGOGASTRODUODENOSCOPY: SHX5428

## 2022-05-16 HISTORY — PX: LITHOTRIPSY: SHX5546

## 2022-05-16 HISTORY — PX: SPHINCTEROTOMY: SHX5279

## 2022-05-16 HISTORY — PX: REMOVAL OF STONES: SHX5545

## 2022-05-16 LAB — HEPATIC FUNCTION PANEL
ALT: 354 U/L — ABNORMAL HIGH (ref 0–44)
AST: 166 U/L — ABNORMAL HIGH (ref 15–41)
Albumin: 3.8 g/dL (ref 3.5–5.0)
Alkaline Phosphatase: 489 U/L — ABNORMAL HIGH (ref 38–126)
Bilirubin, Direct: 0.8 mg/dL — ABNORMAL HIGH (ref 0.0–0.2)
Indirect Bilirubin: 1.7 mg/dL — ABNORMAL HIGH (ref 0.3–0.9)
Total Bilirubin: 2.5 mg/dL — ABNORMAL HIGH (ref 0.3–1.2)
Total Protein: 7 g/dL (ref 6.5–8.1)

## 2022-05-16 SURGERY — ERCP, WITH INTERVENTION IF INDICATED
Anesthesia: General | Site: Esophagus

## 2022-05-16 MED ORDER — LACTATED RINGERS IV SOLN: INTRAVENOUS | Status: DC

## 2022-05-16 MED ORDER — SODIUM CHLORIDE 0.9 % IV SOLN
INTRAVENOUS | Status: AC
  Filled 2022-05-16: qty 50

## 2022-05-16 MED ORDER — PROPOFOL 10 MG/ML IV BOLUS
INTRAVENOUS | Status: AC
  Filled 2022-05-16: qty 20

## 2022-05-16 MED ORDER — ROCURONIUM BROMIDE 10 MG/ML (PF) SYRINGE
PREFILLED_SYRINGE | INTRAVENOUS | Status: AC
  Filled 2022-05-16: qty 10

## 2022-05-16 MED ORDER — INDOMETHACIN 25 MG SUPPOSITORY
100.0000 mg | Freq: Once | RECTAL | Status: DC
Start: 2022-05-16 — End: 2022-05-16
  Filled 2022-05-16: qty 4

## 2022-05-16 MED ORDER — STERILE WATER FOR IRRIGATION IR SOLN
Status: DC | PRN
Start: 2022-05-16 — End: 2022-05-16
  Administered 2022-05-16: 1000 mL

## 2022-05-16 MED ORDER — FENTANYL CITRATE (PF) 250 MCG/5ML IJ SOLN
INTRAMUSCULAR | Status: AC
  Filled 2022-05-16: qty 5

## 2022-05-16 MED ORDER — FENTANYL CITRATE (PF) 100 MCG/2ML IJ SOLN
INTRAMUSCULAR | Status: DC | PRN
  Administered 2022-05-16: 50 ug via INTRAVENOUS
  Administered 2022-05-16 (×2): 100 ug via INTRAVENOUS

## 2022-05-16 MED ORDER — ROCURONIUM BROMIDE 100 MG/10ML IV SOLN
INTRAVENOUS | Status: DC | PRN
  Administered 2022-05-16: 50 mg via INTRAVENOUS

## 2022-05-16 MED ORDER — ORAL CARE MOUTH RINSE: 15.0000 mL | Freq: Once | OROMUCOSAL | Status: AC

## 2022-05-16 MED ORDER — MEPERIDINE HCL 50 MG/ML IJ SOLN: 6.2500 mg | INTRAMUSCULAR | Status: DC | PRN

## 2022-05-16 MED ORDER — ONDANSETRON HCL 4 MG/2ML IJ SOLN
INTRAMUSCULAR | Status: AC
  Filled 2022-05-16: qty 2

## 2022-05-16 MED ORDER — URSODIOL 300 MG PO CAPS
300.0000 mg | ORAL_CAPSULE | Freq: Two times a day (BID) | ORAL | Status: DC
  Administered 2022-05-16 – 2022-05-17 (×2): 300 mg via ORAL
  Filled 2022-05-16 (×5): qty 1

## 2022-05-16 MED ORDER — CEFAZOLIN SODIUM-DEXTROSE 2-4 GM/100ML-% IV SOLN
INTRAVENOUS | Status: AC
  Filled 2022-05-16: qty 100

## 2022-05-16 MED ORDER — INDOMETHACIN 50 MG RE SUPP
RECTAL | Status: AC
  Administered 2022-05-16: 100 mg
  Filled 2022-05-16: qty 2

## 2022-05-16 MED ORDER — CHLORHEXIDINE GLUCONATE 0.12 % MT SOLN
15.0000 mL | Freq: Once | OROMUCOSAL | Status: AC
  Administered 2022-05-16: 15 mL via OROMUCOSAL

## 2022-05-16 MED ORDER — SUGAMMADEX SODIUM 500 MG/5ML IV SOLN
INTRAVENOUS | Status: DC | PRN
  Administered 2022-05-16: 200 mg via INTRAVENOUS

## 2022-05-16 MED ORDER — DEXAMETHASONE SODIUM PHOSPHATE 10 MG/ML IJ SOLN
INTRAMUSCULAR | Status: AC
  Filled 2022-05-16: qty 1

## 2022-05-16 MED ORDER — MIDAZOLAM HCL 2 MG/2ML IJ SOLN
INTRAMUSCULAR | Status: AC
  Filled 2022-05-16: qty 2

## 2022-05-16 MED ORDER — HYDROMORPHONE HCL 1 MG/ML IJ SOLN: 0.2500 mg | INTRAMUSCULAR | Status: DC | PRN

## 2022-05-16 MED ORDER — SODIUM CHLORIDE 0.9 % IV SOLN: INTRAVENOUS | Status: DC

## 2022-05-16 MED ORDER — MIDAZOLAM HCL 5 MG/5ML IJ SOLN
INTRAMUSCULAR | Status: DC | PRN
  Administered 2022-05-16: 2 mg via INTRAVENOUS

## 2022-05-16 MED ORDER — CEFAZOLIN SODIUM-DEXTROSE 2-4 GM/100ML-% IV SOLN
2.0000 g | Freq: Once | INTRAVENOUS | Status: AC
  Administered 2022-05-16: 2 g via INTRAVENOUS

## 2022-05-16 MED ORDER — SODIUM CHLORIDE 0.9 % IV SOLN
INTRAVENOUS | Status: DC | PRN
  Administered 2022-05-16: 32 mL

## 2022-05-16 MED ORDER — DEXAMETHASONE SODIUM PHOSPHATE 10 MG/ML IJ SOLN
INTRAMUSCULAR | Status: DC | PRN
  Administered 2022-05-16: 10 mg via INTRAVENOUS

## 2022-05-16 MED ORDER — PROPOFOL 10 MG/ML IV BOLUS
INTRAVENOUS | Status: DC | PRN
  Administered 2022-05-16: 150 mg via INTRAVENOUS

## 2022-05-16 MED ORDER — GLUCAGON HCL RDNA (DIAGNOSTIC) 1 MG IJ SOLR
INTRAMUSCULAR | Status: DC | PRN
  Administered 2022-05-16: .25 mg via INTRAVENOUS

## 2022-05-16 MED ORDER — GLUCAGON HCL RDNA (DIAGNOSTIC) 1 MG IJ SOLR
INTRAMUSCULAR | Status: AC
  Filled 2022-05-16: qty 2

## 2022-05-16 MED ORDER — ONDANSETRON HCL 4 MG/2ML IJ SOLN
INTRAMUSCULAR | Status: DC | PRN
Start: 2022-05-16 — End: 2022-05-16
  Administered 2022-05-16: 4 mg via INTRAVENOUS

## 2022-05-16 SURGICAL SUPPLY — 28 items
BALLN RETRIEVAL 12X15 (BALLOONS) IMPLANT
BALN RTRVL 200 6-7FR 12-15 (BALLOONS)
BASKET TRAPEZOID 3X6 (MISCELLANEOUS) IMPLANT
BASKET TRAPEZOID LITHO 2.0X5 (MISCELLANEOUS) ×2 IMPLANT
BSKT STON RTRVL TRAPEZOID 2X5 (MISCELLANEOUS)
BSKT STON RTRVL TRAPEZOID 3X6 (MISCELLANEOUS)
DEVICE INFLATION ENCORE 26 (MISCELLANEOUS) IMPLANT
DEVICE LOCKING W-BIOPSY CAP (MISCELLANEOUS) ×2 IMPLANT
GLOVE BIOGEL PI IND STRL 7.0 (GLOVE) ×4 IMPLANT
GLOVE BIOGEL PI INDICATOR 7.0 (GLOVE) ×2
GUIDEWIRE HYDRA JAGWIRE .35 (WIRE) IMPLANT
GUIDEWIRE JAG HINI 025X260CM (WIRE) IMPLANT
KIT ENDO PROCEDURE PEN (KITS) ×2 IMPLANT
KIT TURNOVER KIT A (KITS) ×2 IMPLANT
LUBRICANT JELLY 4.5OZ STERILE (MISCELLANEOUS) IMPLANT
PAD ARMBOARD 7.5X6 YLW CONV (MISCELLANEOUS) ×2 IMPLANT
POSITIONER HEAD 8X9X4 ADT (SOFTGOODS) IMPLANT
SCOPE SPY DS DISPOSABLE (MISCELLANEOUS) ×2 IMPLANT
SNARE ROTATE MED OVAL 20MM (MISCELLANEOUS) IMPLANT
SNARE SHORT THROW 13M SML OVAL (MISCELLANEOUS) IMPLANT
SPHINCTEROTOME AUTOTOME .25 (MISCELLANEOUS) IMPLANT
SPHINCTEROTOME HYDRATOME 44 (MISCELLANEOUS) ×2 IMPLANT
STENT RX PRELOADED 10FRX7CM (Stent) ×1 IMPLANT
SYSTEM CONTINUOUS INJECTION (MISCELLANEOUS) ×2 IMPLANT
TUBING INSUFFLATOR CO2MPACT (TUBING) ×2 IMPLANT
WALLSTENT METAL COVERED 10X60 (STENTS) IMPLANT
WALLSTENT METAL COVERED 10X80 (STENTS) IMPLANT
WATER STERILE IRR 1000ML POUR (IV SOLUTION) ×2 IMPLANT

## 2022-05-16 NOTE — Transfer of Care (Signed)
Immediate Anesthesia Transfer of Care Note ? ?Patient: Bonnie Fleming ? ?Procedure(s) Performed: ENDOSCOPIC RETROGRADE CHOLANGIOPANCREATOGRAPHY (ERCP) (Esophagus) ?ESOPHAGOGASTRODUODENOSCOPY (EGD) (Esophagus) ? ?Patient Location: PACU ? ?Anesthesia Type:General ? ?Level of Consciousness: awake, alert , drowsy and patient cooperative ? ?Airway & Oxygen Therapy: Patient Spontanous Breathing and Patient connected to nasal cannula oxygen ? ?Post-op Assessment: Report given to RN, Post -op Vital signs reviewed and stable and Patient moving all extremities X 4 ? ?Post vital signs: Reviewed and stable ? ?Last Vitals:  ?Vitals Value Taken Time  ?BP    ?Temp    ?Pulse    ?Resp    ?SpO2    ? ? ?Last Pain:  ?Vitals:  ? 05/16/22 1111  ?TempSrc: Oral  ?PainSc: 2   ?   ? ?Patients Stated Pain Goal: 5 (05/16/22 1111) ? ?Complications: No notable events documented. ?

## 2022-05-16 NOTE — Op Note (Signed)
[facility] ?Patient Name: Bonnie Fleming ?Procedure Date: 05/16/2022 11:39 AM ?MRN: [id number] ?Date of Birth: [birth year] ?Attending MD: Hildred Laser , MD ?CSN: [id number] ?Age: 51 ?Admit Type: Inpatient ?Procedure:                ERCP ?Indications:              Jaundice ?Providers:                Hildred Laser, MD, Charlsie Quest Theda Sers RN, Therapist, sports,  ?                          Crystal Page ?Referring MD:              ?Medicines:                General Anesthesia ?Complications:            No immediate complications. ?Estimated Blood Loss:     Estimated blood loss was minimal resulting from  ?                          balloon dilation of major papilla. ?Procedure:                Pre-Anesthesia Assessment: ?                          - Prior to the procedure, a History and Physical  ?                          was performed, and patient medications and  ?                          allergies were reviewed. The patient's tolerance of  ?                          previous anesthesia was also reviewed. The risks  ?                          and benefits of the procedure and the sedation  ?                          options and risks were discussed with the patient.  ?                          All questions were answered, and informed consent  ?                          was obtained. Prior Anticoagulants: The patient has  ?                          taken no previous anticoagulant or antiplatelet  ?                          agents. ASA Grade Assessment: II - A patient with  ?                          mild systemic  disease. After reviewing the risks  ?                          and benefits, the patient was deemed in  ?                          satisfactory condition to undergo the procedure. ?                          After obtaining informed consent, the scope was  ?                          passed under direct vision. Throughout the  ?                          procedure, the patient's blood pressure, pulse, and  ?                           oxygen saturations were monitored continuously. The  ?                          [employer] D single use  ?                          duodenoscope was introduced through the mouth, and  ?                          used to inject contrast into and used to inject  ?                          contrast into the bile duct. The ERCP was  ?                          accomplished without difficulty. The patient  ?                          tolerated the procedure well. ?Scope In: 12:29:14 PM ?Scope Out: 1:11:36 PM ?Total Procedure Duration: 0 hours 42 minutes 22 seconds  ?Findings: ?     The scout film was normal. The esophagus was successfully intubated  ?     under direct vision. The scope was advanced to a normal major papilla in  ?     the descending duodenum without detailed examination of the pharynx,  ?     larynx and associated structures, and upper GI tract. The upper GI tract  ?     was grossly normal. with erosion and erythema at orifice. The major  ?     papilla was normal. The minor papilla was normal. The bile duct was  ?     deeply cannulated with the Autotome sphincterotome. Contrast was  ?     injected. The entire biliary tree was moderately dilated, with a stone  ?     causing an obstruction. The common bile duct and common hepatic duct  ?     contained filling defect(s) thought to be a stone. A 6 mm biliary  ?  sphincterotomy was made with a braided Autotome sphincterotome using  ?     ERBE electrocautery. There was no post-sphincterotomy bleeding. Dilation  ?     of major papilla with an 08-08-09 mm balloon (to a maximum balloon size of  ?     8 mm), an 08-08-09 mm balloon (to a maximum balloon size of 9 mm) and an  ?     08-08-09 mm balloon (to a maximum balloon size of 10 mm) dilator was  ?     successful. Lithotripsy with a basket-type device was successful. To  ?     discover objects, the biliary tree was swept with a 12 mm balloon and  ?     basket starting at the  bifurcation. Many stones were removed. A few  ?     stones remained. One 10 Fr by 7 cm plastic stent with a single external  ?     flap and a single internal flap was placed 6 cm into the common bile  ?     duct. Bile flowed through the stent. The stent was in good position. ?Impression:               - The major papilla appeared normal with short  ?                          intramural segment limiting size of sphincterotomy ?                          - A filling defect consistent with stone stones  ?                          including large stone and common bile duct was seen  ?                          on the cholangiogram. ?                          - Biliary sphincterotomy performed. Sphincterotomy  ?                          dilated to 10 mm with TTS balloon. ?                          - Large stone broken into smaller pieces with  ?                          mechanical lithotripsy. Multiple fragments were  ?                          removed with Damia basket and stone balloon  ?                          extractor. ?                          - Bile duct could not be cleared of all the stones. ?                          -  One plastic stent was placed into the common bile  ?                          duct. ?Moderate Sedation: ?     Per Anesthesia Care ?Recommendation:           - Return patient to hospital ward for ongoing care. ?                          - Avoid aspirin and nonsteroidal anti-inflammatory  ?                          medicines for 3 days. ?                          - Clear liquid diet today. ?                          -Urso 500 mg by mouth twice daily. ?                          - Repeat ERCP in 3 to 4 weeks. ?Procedure Code(s):        --- Professional --- ?                          [id number], Endoscopic retrograde  ?                          cholangiopancreatography (ERCP); with placement of  ?                          endoscopic stent into biliary or pancreatic duct,  ?                           including pre- and post-dilation and guide wire  ?                          passage, when performed, including sphincterotomy,  ?                          when performed, each stent ?                          [id number], Endoscopic retrograde  ?                          cholangiopancreatography (ERCP); with destruction  ?                          of calculi, any method (eg, mechanical,  ?                          electrohydraulic, lithotripsy) ?Diagnosis Code(s):        --- Professional --- ?                          K80.51, Calculus of bile duct without cholangitis  ?  or cholecystitis with obstruction ?                          R17, Unspecified jaundice ?                          R93.2, Abnormal findings on diagnostic imaging of  ?                          liver and biliary tract ?CPT copyright 2019 [employer]. All rights reserved. ?The codes documented in this report are preliminary and upon coder review may  ?be revised to meet current compliance requirements. ?Hildred Laser, MD ?Hildred Laser, MD ?05/16/2022 5:41:31 PM ?This report has been signed electronically. ?Number of Addenda: 0 ?

## 2022-05-16 NOTE — Anesthesia Procedure Notes (Signed)
Procedure Name: Intubation ?Date/Time: 05/16/2022 12:09 PM ?Performed by: Jonna Munro, CRNA ?Pre-anesthesia Checklist: Patient identified, Emergency Drugs available, Suction available, Patient being monitored and Timeout performed ?Patient Re-evaluated:Patient Re-evaluated prior to induction ?Oxygen Delivery Method: Circle system utilized ?Preoxygenation: Pre-oxygenation with 100% oxygen ?Induction Type: IV induction ?Laryngoscope Size: Mac and 3 ?Grade View: Grade I ?Tube type: Oral ?Tube size: 7.0 mm ?Number of attempts: 1 ?Airway Equipment and Method: Stylet ?Placement Confirmation: ETT inserted through vocal cords under direct vision, positive ETCO2 and breath sounds checked- equal and bilateral ?Secured at: 23 cm ?Tube secured with: Tape ?Dental Injury: Teeth and Oropharynx as per pre-operative assessment  ? ? ? ? ?

## 2022-05-16 NOTE — Anesthesia Preprocedure Evaluation (Signed)
Anesthesia Evaluation  ?Patient identified by MRN, date of birth, ID band ?Patient awake ? ? ? ?Reviewed: ?Allergy & Precautions, NPO status , Patient's Chart, lab work & pertinent test results ? ?Airway ?Mallampati: III ? ?TM Distance: >3 FB ?Neck ROM: Full ? ? ? Dental ? ?(+) Dental Advisory Given, Teeth Intact ?  ?Pulmonary ?neg pulmonary ROS,  ?  ?Pulmonary exam normal ?breath sounds clear to auscultation ? ? ? ? ? ? Cardiovascular ?negative cardio ROS ?Normal cardiovascular exam ?Rhythm:Regular Rate:Normal ? ? ?  ?Neuro/Psych ?negative neurological ROS ? negative psych ROS  ? GI/Hepatic ?Neg liver ROS, GERD  Medicated,  ?Endo/Other  ?negative endocrine ROS ? Renal/GU ?negative Renal ROS  ?negative genitourinary ?  ?Musculoskeletal ?negative musculoskeletal ROS ?(+)  ? Abdominal ?  ?Peds ?negative pediatric ROS ?(+)  Hematology ?negative hematology ROS ?(+)   ?Anesthesia Other Findings ? ? Reproductive/Obstetrics ?negative OB ROS ? ?  ? ? ? ? ? ? ? ? ? ? ? ? ? ?  ?  ? ? ? ? ? ? ? ?Anesthesia Physical ?Anesthesia Plan ? ?ASA: 2 ? ?Anesthesia Plan: General  ? ?Post-op Pain Management: Dilaudid IV  ? ?Induction: Intravenous ? ?PONV Risk Score and Plan: 4 or greater and Ondansetron and Dexamethasone ? ?Airway Management Planned: Oral ETT ? ?Additional Equipment:  ? ?Intra-op Plan:  ? ?Post-operative Plan: Extubation in OR ? ?Informed Consent: I have reviewed the patients History and Physical, chart, labs and discussed the procedure including the risks, benefits and alternatives for the proposed anesthesia with the patient or authorized representative who has indicated his/her understanding and acceptance.  ? ? ? ?Dental advisory given ? ?Plan Discussed with: CRNA and Surgeon ? ?Anesthesia Plan Comments:   ? ? ? ? ? ? ?Anesthesia Quick Evaluation ? ?

## 2022-05-16 NOTE — Op Note (Signed)
[facility] ?Patient Name: Bonnie Fleming ?Procedure Date: 05/16/2022 12:09 PM ?MRN: [id number] ?Date of Birth: Jun 09, 1971 ?Attending MD: Hildred Laser , MD ?CSN: [id number] ?Age: 51 ?Admit Type: Inpatient ?Procedure:                Upper GI endoscopy ?Indications:              Epigastric abdominal pain, Heartburn ?Providers:                Hildred Laser, MD, Charlsie Quest Theda Sers RN, Therapist, sports,  ?                          Crystal Page ?Referring MD:             Kathie Dike, MD ?Medicines:                General Anesthesia ?Complications:            No immediate complications. ?Estimated Blood Loss:     Estimated blood loss was minimal. ?Procedure:                Pre-Anesthesia Assessment: ?                          - Prior to the procedure, a History and Physical  ?                          was performed, and patient medications and  ?                          allergies were reviewed. The patient's tolerance of  ?                          previous anesthesia was also reviewed. The risks  ?                          and benefits of the procedure and the sedation  ?                          options and risks were discussed with the patient.  ?                          All questions were answered, and informed consent  ?                          was obtained. Prior Anticoagulants: The patient has  ?                          taken no previous anticoagulant or antiplatelet  ?                          agents except for NSAID medication. ASA Grade  ?                          Assessment: II - A patient with mild systemic  ?  disease. After reviewing the risks and benefits,  ?                          the patient was deemed in satisfactory condition to  ?                          undergo the procedure. ?                          After obtaining informed consent, the endoscope was  ?                          passed under direct vision. Throughout the  ?                          procedure, the  patient's blood pressure, pulse, and  ?                          oxygen saturations were monitored continuously. The  ?                          GIF-H190 ([id number]) scope was introduced through the  ?                          mouth, and advanced to the second part of duodenum.  ?                          The upper GI endoscopy was accomplished without  ?                          difficulty. The patient tolerated the procedure  ?                          well. ?Scope In: 12:22:33 PM ?Scope Out: 12:27:03 PM ?Total Procedure Duration: 0 hours 4 minutes 30 seconds  ?Findings: ?     The esophagus was normal. ?     The stomach was normal. ?     The examined duodenum was normal. ?     The examined esophagus was normal. ?     The Z-line was regular and was found 39 cm from the incisors. ?     The cardia, gastric fundus and gastric body were normal. ?     Diffuse moderate inflammation characterized by fine nodularity and  ?     granularity was found in the gastric antrum. Biopsies were taken with a  ?     cold forceps for histology. ?     The duodenal bulb, second portion of the duodenum and major papilla were  ?     normal. ?Impression:               - Normal esophagus. ?                          - Normal stomach. ?                          - Normal examined  duodenum. ?                          - Normal esophagus. ?                          - Z-line regular, 39 cm from the incisors. ?                          - Normal cardia, gastric fundus and gastric body. ?                          - Gastritis. Biopsied. ?                          - Normal duodenal bulb, second portion of the  ?                          duodenum and major papilla. ?Moderate Sedation: ?     Per Anesthesia Care ?Recommendation:           - Await pathology results. ?                          - See the other procedure note for documentation of  ?                          additional recommendations. ?Procedure Code(s):        --- Professional --- ?                           [phone], Esophagogastroduodenoscopy, flexible,  ?                          transoral; with biopsy, single or multiple ?Diagnosis Code(s):        --- Professional --- ?                          K29.70, Gastritis, unspecified, without bleeding ?                          R10.13, Epigastric pain ?                          R12, Heartburn ?CPT copyright 2019 [employer]. All rights reserved. ?The codes documented in this report are preliminary and upon coder review may  ?be revised to meet current compliance requirements. ?Hildred Laser, MD ?Hildred Laser, MD ?05/16/2022 1:30:36 PM ?This report has been signed electronically. ?Number of Addenda: 0 ?

## 2022-05-16 NOTE — Progress Notes (Signed)
Pt tolerated clear liquids well denies any N/V.  ?

## 2022-05-16 NOTE — Anesthesia Postprocedure Evaluation (Signed)
Anesthesia Post Note ? ?Patient: Letesha Klecker ? ?Procedure(s) Performed: ENDOSCOPIC RETROGRADE CHOLANGIOPANCREATOGRAPHY (ERCP) (Esophagus) ?ESOPHAGOGASTRODUODENOSCOPY (EGD) (Esophagus) ?BILIARY DILITATION ?REMOVAL OF STONES ?SPHINCTEROTOMY DILATED TO 10MM ?MECHANICAL LITHOTRIPSY ?BILIARY STENT PLACEMENT ? ?Patient location during evaluation: PACU ?Anesthesia Type: General ?Level of consciousness: awake and alert and oriented ?Pain management: pain level controlled ?Vital Signs Assessment: post-procedure vital signs reviewed and stable ?Respiratory status: spontaneous breathing, nonlabored ventilation and respiratory function stable ?Cardiovascular status: blood pressure returned to baseline and stable ?Postop Assessment: no apparent nausea or vomiting ?Anesthetic complications: no ? ? ?No notable events documented. ? ? ?Last Vitals:  ?Vitals:  ? 05/16/22 1345 05/16/22 1404  ?BP: 119/81 127/79  ?Pulse: 78 76  ?Resp: 15 16  ?Temp:  36.7 ?C  ?SpO2: 99% 100%  ?  ?Last Pain:  ?Vitals:  ? 05/16/22 1404  ?TempSrc: Oral  ?PainSc:   ? ? ?  ?  ?  ?  ?  ?  ? ?Dasha Kawabata C Shemuel Harkleroad ? ? ? ? ?

## 2022-05-16 NOTE — Progress Notes (Signed)
Brief EGD note. ? ?Normal mucosa of the esophagus and proximal stomach. ?Fine diffuse nodularity to antral mucosa.  Biopsies taken. ?Pyloric channel patent. ?Normal bulbar and postbulbar mucosa. ? ? ?ERCP. ? ?Normal ampulla of Vater with short intramural segment. ?CBD easily cannulated with Rx 44 autotome and 035 Hydra Jagwire. ?Dilated CBD and CHD with multiple filling defects including a large stone in CBD. ?Biliary sphincterotomy performed.  Sphincterotomy was further dilated to 10 mm with a balloon. ?Mechanical lithotripsy performed numerous fragments removed and some came out spontaneously. ?Bile duct could not be cleared of all the fragments. ?10 French 7 cm biliary stent left in place. ?Pancreatic duct was not cannulated or filled with contrast. ? ?Patient tolerated the procedure well. ?

## 2022-05-16 NOTE — Progress Notes (Signed)
?Subjective: ? ?Patient continues to complain of midepigastric pain.  She denies chest pain shortness of breath fever or chills. ? ?Current Medications: ? ?Current Facility-Administered Medications:  ?  0.9 %  sodium chloride infusion, , Intravenous, Continuous, Jodi Mourning, Kristen S, PA-C ?  0.9 % NaCl with KCl 20 mEq/ L  infusion, , Intravenous, Continuous, Mansy, Jan A, MD, Last Rate: 100 mL/hr at 05/15/22 1105, New Bag at 05/15/22 1105 ?  [MAR Hold] acetaminophen (TYLENOL) tablet 650 mg, 650 mg, Oral, Q6H PRN **OR** [MAR Hold] acetaminophen (TYLENOL) suppository 650 mg, 650 mg, Rectal, Q6H PRN, Mansy, Jan A, MD ?  ceFAZolin (ANCEF) 2-4 GM/100ML-% IVPB, , , ,  ?  ceFAZolin (ANCEF) IVPB 2g/100 mL premix, 2 g, Intravenous, Once, Kambrie Eddleman U, MD ?  glucagon (human recombinant) (GLUCAGEN) 1 MG injection, , , ,  ?  lactated ringers infusion, , Intravenous, Continuous, Battula, Rajamani C, MD, Last Rate: 50 mL/hr at 05/16/22 1116, New Bag at 05/16/22 1116 ?  [MAR Hold] magnesium hydroxide (MILK OF MAGNESIA) suspension 30 mL, 30 mL, Oral, Daily PRN, Mansy, Jan A, MD ?  [facility] Hold] morphine (PF) 2 MG/ML injection 2 mg, 2 mg, Intravenous, Q4H PRN, Mansy, Jan A, MD, 2 mg at 05/15/22 1343 ?  [MAR Hold] ondansetron (ZOFRAN) tablet 4 mg, 4 mg, Oral, Q6H PRN **OR** [MAR Hold] ondansetron (ZOFRAN) injection 4 mg, 4 mg, Intravenous, Q6H PRN, Mansy, Arvella Merles, MD ?  [facility] Hold] ondansetron (ZOFRAN-ODT) disintegrating tablet 8 mg, 8 mg, Oral, Q8H PRN, Mansy, Arvella Merles, MD ?  [facility] Hold] pantoprazole (PROTONIX) EC tablet 40 mg, 40 mg, Oral, BID, Harper, Kristen S, PA-C, 40 mg at 05/15/22 2207 ?  sodium chloride 0.9 % infusion, , , ,  ?  [MAR Hold] traZODone (DESYREL) tablet 25 mg, 25 mg, Oral, QHS PRN, Mansy, Arvella Merles, MD ? ? ?Objective: ?Blood pressure 107/62, pulse 78, temperature 98.6 ?F (37 ?C), temperature source Oral, resp. rate 15, height _0  (1.575 m), weight 67.3 kg, SpO2 99 %. ?Patient is alert and in no acute  distress. ?Conjunctiva is pink.  Sclera remains icteric. ?No neck masses thyromegaly or lymphadenopathy noted. ?Cardiac exam with regular rhythm normal S1 and S2.  No murmur or gallop noted. ?Auscultation lungs reveal vesicular breath sounds bilaterally. ?Abdomen is symmetrical and soft.  She remains with moderate midepigastric tenderness.  No organomegaly or masses. ? ?Labs/studies Results: ? ? ? ?  Latest Ref Rng & Units 05/15/2022  ?  5:49 AM 05/14/2022  ?  1:11 PM 05/16/2020  ?  2:01 PM  ?CBC  ?WBC 4.0 - 10.5 K/uL 8.3   10.4   8.3    ?Hemoglobin 12.0 - 15.0 g/dL 11.8   13.7   13.3    ?Hematocrit 36.0 - 46.0 % 36.2   40.0   40.0    ?Platelets 150 - 400 K/uL 258   315   282    ?  ? ?  Latest Ref Rng & Units 05/16/2022  ?  6:16 AM 05/15/2022  ?  5:49 AM 05/14/2022  ?  1:11 PM  ?CMP  ?Glucose 70 - 99 mg/dL  114   122    ?BUN 6 - 20 mg/dL  6   9    ?Creatinine 0.44 - 1.00 mg/dL  0.34   0.40    ?Sodium 135 - 145 mmol/L  138   137    ?Potassium 3.5 - 5.1 mmol/L  3.5   3.7    ?Chloride  98 - 111 mmol/L  110   106    ?CO2 22 - 32 mmol/L  21   23    ?Calcium 8.9 - 10.3 mg/dL  9.0   9.5    ?Total Protein 6.5 - 8.1 g/dL 7.0   7.0   8.4    ?Total Bilirubin 0.3 - 1.2 mg/dL 2.5   5.5   3.5    ?Alkaline Phos 38 - 126 U/L 489   450   472    ?AST 15 - 41 U/L 166   285   422    ?ALT 0 - 44 U/L 354   471   500    ?  ? ?  Latest Ref Rng & Units 05/16/2022  ?  6:16 AM 05/15/2022  ?  5:49 AM 05/14/2022  ?  1:11 PM  ?Hepatic Function  ?Total Protein 6.5 - 8.1 g/dL 7.0   7.0   8.4    ?Albumin 3.5 - 5.0 g/dL 3.8   4.0   4.5    ?AST 15 - 41 U/L 166   285   422    ?ALT 0 - 44 U/L 354   471   500    ?Alk Phosphatase 38 - 126 U/L 489   450   472    ?Total Bilirubin 0.3 - 1.2 mg/dL 2.5   5.5   3.5    ?Bilirubin, Direct 0.0 - 0.2 mg/dL 0.8      ?  ? ?Assessment: ? ?#1.  Obstructive jaundice.  She has dilated bile duct down to papilla.  She is status post remote cholecystectomy.  Her bilirubin and transaminases are down.  She could have small stone not  seen on CT.  Other possible diagnoses include papillary stenosis or ampullary tumor. ? ?#2.  Epigastric pain could be due to biliary tract disease but need to rule out peptic ulcer disease given that she has been taking ibuprofen 800 mg almost daily. ? ? ?Plan: ? ?Proceed with esophagogastroduodenoscopy followed by ERCP with therapeutic intervention. ? ? ? ? ? ?

## 2022-05-16 NOTE — Progress Notes (Signed)
?PROGRESS NOTE ? ? ? ?Bonnie Fleming  ZWC:[id number] DOB: 20-Apr-1971 DOA: 05/14/2022 ?PCP: Raiford Simmonds., PA-C ? ? ?Brief Narrative:  ?  ?51 year old female with a history of GERD, admitted to the hospital with epigastric abdominal pain and nausea.  Noted to have elevated liver enzymes as well as hyperbilirubinemia.  Imaging indicated dilated biliary ducts.  GI following.  Patient has undergone ERCP 5/17 with biliary sphincterotomy and removal of CBD stones and subsequent placement of stent. ? ?Assessment & Plan: ?  ?Principal Problem: ?  Obstructive jaundice ?Active Problems: ?  GERD (gastroesophageal reflux disease) ?  Elevated LFTs ?  Epigastric pain ?  Dilated cbd, acquired ?  Intrahepatic bile duct dilation ? ?Assessment and Plan: ? ? ?Elevated LFTs with obstructive jaundice ?-Noted on imaging to have dilated common bile duct ?-She is already status post cholecystectomy ?-Status post ERCP 5/17 with noted large stone in CBD status post sphincterotomy and mechanical lithotripsy with placement of biliary stent ?-Now on clear liquid diet ?-Follow a.m. labs ?-Appreciate further GI follow-up ?  ?Epigastric abdominal pain ?-She does have a history of NSAID use ?-Continue on PPI ?-EGD with no significant findings on 5/17 ?  ?  ?DVT prophylaxis: SCDs ?  ?Code Status: Full code ?Family Communication: Discussed with daughter at the bedside ?Disposition Plan: Status is: Inpatient ?Remains inpatient appropriate because: Likely discharge home once jaundice has been addressed ?  ?  ?  ?  ?Consultants:  ?Gastroenterology ?  ?Procedures:  ?EGD and ERCP with sphincterotomy and stent placement 5/17 ?  ?Antimicrobials:  ?  ?Anti-infectives (From admission, onward)  ? ? Start     Dose/Rate Route Frequency Ordered Stop  ? 05/16/22 1130  ceFAZolin (ANCEF) IVPB 2g/100 mL premix       ? 2 g ?200 mL/hr over 30 Minutes Intravenous  Once 05/16/22 1116 05/16/22 1230  ? 05/16/22 1117  ceFAZolin (ANCEF) 2-4 GM/100ML-% IVPB        ?Note to Pharmacy: Abbie Sons S: cabinet override  ?    05/16/22 1117 05/16/22 1230  ? ?  ? ? ? ?Subjective: ?Patient seen and evaluated today with no new acute complaints or concerns. No acute concerns or events noted overnight. ? ?Objective: ?Vitals:  ? 05/16/22 1325 05/16/22 1330 05/16/22 1345 05/16/22 1404  ?BP: (!) 114/7 121/72 119/81 127/79  ?Pulse: 84 85 78 76  ?Resp: (!) '25  15 16  '$ ?Temp: 97.6 ?F (36.4 ?C)   98 ?F (36.7 ?C)  ?TempSrc:    Oral  ?SpO2: 98% 98% 99% 100%  ?Weight:      ?Height:      ? ? ?Intake/Output Summary (Last 24 hours) at 05/16/2022 1615 ?Last data filed at 05/16/2022 1322 ?Gross per 24 hour  ?Intake 800 ml  ?Output --  ?Net 800 ml  ? ?Filed Weights  ? 05/14/22 1233 05/14/22 2322  ?Weight: 68.9 kg 67.3 kg  ? ? ?Examination: ? ?General exam: Appears calm and comfortable  ?Respiratory system: Clear to auscultation. Respiratory effort normal. ?Cardiovascular system: S1 & S2 heard, RRR.  ?Gastrointestinal system: Abdomen is soft ?Central nervous system: Alert and awake ?Extremities: No edema ?Skin: No significant lesions noted ?Psychiatry: Flat affect. ? ? ? ?Data Reviewed: I have personally reviewed following labs and imaging studies ? ?CBC: ?Recent Labs  ?Lab 05/14/22 ?1311 05/15/22 ?2353  ?WBC 10.4 8.3  ?HGB 13.7 11.8*  ?HCT 40.0 36.2  ?MCV 93.7 93.5  ?PLT 315 258  ? ?Basic Metabolic Panel: ?Recent Labs  ?Lab  05/14/22 ?1311 05/15/22 ?0454  ?NA 137 138  ?K 3.7 3.5  ?CL 106 110  ?CO2 23 21*  ?GLUCOSE 122* 114*  ?BUN 9 6  ?CREATININE 0.40* 0.34*  ?CALCIUM 9.5 9.0  ? ?GFR: ?Estimated Creatinine Clearance: 77.4 mL/min (A) (by C-G formula based on SCr of 0.34 mg/dL (L)). ?Liver Function Tests: ?Recent Labs  ?Lab 05/14/22 ?1311 05/15/22 ?0981 05/16/22 ?1914  ?AST 422* 285* 166*  ?ALT 500* 471* 354*  ?ALKPHOS 472* 450* 489*  ?BILITOT 3.5* 5.5* 2.5*  ?PROT 8.4* 7.0 7.0  ?ALBUMIN 4.5 4.0 3.8  ? ?Recent Labs  ?Lab 05/14/22 ?1311  ?LIPASE 34  ? ?Recent Labs  ?Lab 05/14/22 ?2127  ?AMMONIA 37*   ? ?Coagulation Profile: ?No results for input(s): INR, PROTIME in the last 168 hours. ?Cardiac Enzymes: ?No results for input(s): CKTOTAL, CKMB, CKMBINDEX, TROPONINI in the last 168 hours. ?BNP (last 3 results) ?No results for input(s): PROBNP in the last 8760 hours. ?HbA1C: ?No results for input(s): HGBA1C in the last 72 hours. ?CBG: ?No results for input(s): GLUCAP in the last 168 hours. ?Lipid Profile: ?No results for input(s): CHOL, HDL, LDLCALC, TRIG, CHOLHDL, LDLDIRECT in the last 72 hours. ?Thyroid Function Tests: ?No results for input(s): TSH, T4TOTAL, FREET4, T3FREE, THYROIDAB in the last 72 hours. ?Anemia Panel: ?No results for input(s): VITAMINB12, FOLATE, FERRITIN, TIBC, IRON, RETICCTPCT in the last 72 hours. ?Sepsis Labs: ?No results for input(s): PROCALCITON, LATICACIDVEN in the last 168 hours. ? ?No results found for this or any previous visit (from the past 240 hour(s)).  ? ? ? ? ? ?Radiology Studies: ?CT ABDOMEN PELVIS W CONTRAST ? ?Result Date: 05/14/2022 ?CLINICAL DATA:  Abdominal pain EXAM: CT ABDOMEN AND PELVIS WITH CONTRAST TECHNIQUE: Multidetector CT imaging of the abdomen and pelvis was performed using the standard protocol following bolus administration of intravenous contrast. RADIATION DOSE REDUCTION: This exam was performed according to the departmental dose-optimization program which includes automated exposure control, adjustment of the mA and/or kV according to patient size and/or use of iterative reconstruction technique. CONTRAST:  59m OMNIPAQUE IOHEXOL 350 MG/ML SOLN COMPARISON:  05/15/2016 FINDINGS: Motion degraded images. Lower chest: Lung bases are essentially clear. Hepatobiliary: Liver is within normal limits. Status post cholecystectomy. Mild intrahepatic and moderate extrahepatic ductal dilatation, new from prior. Common duct measures with 2.0 cm at its midportion. No choledocholithiasis is evident on CT. Pancreas: Within normal limits. Specifically, no obstructing  pancreatic head mass or ductal dilatation. Spleen: Within normal limits. Adrenals/Urinary Tract: Adrenal glands are within normal limits. Subcentimeter bilateral renal cysts.  No hydronephrosis. Bladder is within normal limits. Stomach/Bowel: Stomach is within normal limits. No evidence of bowel obstruction. Normal appendix (series 2/image 72). No colonic wall thickening or inflammatory changes. Vascular/Lymphatic: No evidence of abdominal aortic aneurysm. No suspicious abdominopelvic lymphadenopathy. Reproductive: Uterine fibroids. Left ovary is within normal limits.  No right adnexal mass. Other: No abdominopelvic ascites. Musculoskeletal: Visualized osseous structures are within normal limits. IMPRESSION: Status post cholecystectomy. Mild intrahepatic and moderate extrahepatic ductal dilatation, new from prior. Common duct measures 2 cm. No choledocholithiasis or obstructing lesion is evident on CT, favoring distal CBD/ampullary stricture. ERCP is suggested. Electronically Signed   By: SJulian HyM.D.   On: 05/14/2022 21:38  ? ?DG ERCP ? ?Result Date: 05/16/2022 ?CLINICAL DATA:  Common bile duct stone. EXAM: ERCP TECHNIQUE: Multiple spot images obtained with the fluoroscopic device and submitted for interpretation post-procedure. FLUOROSCOPY TIME: FLUOROSCOPY TIME 4 minutes, 10 seconds (66 mGy) COMPARISON:  CT abdomen and pelvis-05/14/2022  FINDINGS: Multiple images of the right upper abdominal quadrant during ERCP are provided for review Initial image demonstrates an ERCP probe overlying the right upper abdominal quadrant. Cholecystectomy clips overlies expected location of the gallbladder fossa There is selective cannulation and opacification of the common bile duct which appears moderate to markedly dilated. There is a large nearly occlusive filling defect in the distal aspect of the CBD worrisome for choledocholithiasis. Several additional filling defects are seen within the proximal aspect of the CBD at  the level of the hilum potentially representative of additional choledocholithiasis versus air bubbles There is insufflation of a balloon within the central aspect of the CBD with subsequent biliary sweeping and pres

## 2022-05-16 NOTE — TOC Progression Note (Signed)
?  Transition of Care (TOC) Screening Note ? ? ?Patient Details  ?Name: Bonnie Fleming ?Date of Birth: [birth year] ? ? ?Transition of Care (TOC) CM/SW Contact:    ?Boneta Lucks, RN ?Phone Number: ?05/16/2022, 10:06 AM ? ?ERCP today ? ?Transition of Care Department [facility]) has reviewed patient and no TOC needs have been identified at this time. We will continue to monitor patient advancement through interdisciplinary progression rounds. If new patient transition needs arise, please place a TOC consult. ? ? ? ?Expected Discharge Plan: Home/Self Care ?Barriers to Discharge: Continued Medical Work up ? ?Expected Discharge Plan and Services ?Expected Discharge Plan: Home/Self Care ?  ?  ?  ?   ?

## 2022-05-17 ENCOUNTER — Telehealth: Payer: Self-pay | Admitting: Gastroenterology

## 2022-05-17 ENCOUNTER — Telehealth (INDEPENDENT_AMBULATORY_CARE_PROVIDER_SITE_OTHER): Payer: Self-pay | Admitting: *Deleted

## 2022-05-17 ENCOUNTER — Encounter (HOSPITAL_COMMUNITY): Payer: Self-pay | Admitting: Internal Medicine

## 2022-05-17 DIAGNOSIS — K831 Obstruction of bile duct: Secondary | ICD-10-CM | POA: Diagnosis not present

## 2022-05-17 LAB — CBC
HCT: 33.6 % — ABNORMAL LOW (ref 36.0–46.0)
Hemoglobin: 11.4 g/dL — ABNORMAL LOW (ref 12.0–15.0)
MCH: 31.7 pg (ref 26.0–34.0)
MCHC: 33.9 g/dL (ref 30.0–36.0)
MCV: 93.3 fL (ref 80.0–100.0)
Platelets: 255 10*3/uL (ref 150–400)
RBC: 3.6 MIL/uL — ABNORMAL LOW (ref 3.87–5.11)
RDW: 12.5 % (ref 11.5–15.5)
WBC: 11.3 10*3/uL — ABNORMAL HIGH (ref 4.0–10.5)
nRBC: 0 % (ref 0.0–0.2)

## 2022-05-17 LAB — AMYLASE: Amylase: 59 U/L (ref 28–100)

## 2022-05-17 LAB — COMPREHENSIVE METABOLIC PANEL
ALT: 252 U/L — ABNORMAL HIGH (ref 0–44)
AST: 74 U/L — ABNORMAL HIGH (ref 15–41)
Albumin: 3.5 g/dL (ref 3.5–5.0)
Alkaline Phosphatase: 408 U/L — ABNORMAL HIGH (ref 38–126)
Anion gap: 9 (ref 5–15)
BUN: 9 mg/dL (ref 6–20)
CO2: 19 mmol/L — ABNORMAL LOW (ref 22–32)
Calcium: 8.7 mg/dL — ABNORMAL LOW (ref 8.9–10.3)
Chloride: 111 mmol/L (ref 98–111)
Creatinine, Ser: 0.47 mg/dL (ref 0.44–1.00)
GFR, Estimated: >60 mL/min (ref >60)
Glucose, Bld: 119 mg/dL — ABNORMAL HIGH (ref 70–99)
Potassium: 3.8 mmol/L (ref 3.5–5.1)
Sodium: 139 mmol/L (ref 135–145)
Total Bilirubin: 1.5 mg/dL — ABNORMAL HIGH (ref 0.3–1.2)
Total Protein: 6.7 g/dL (ref 6.5–8.1)

## 2022-05-17 LAB — SURGICAL PATHOLOGY

## 2022-05-17 MED ORDER — OXYCODONE HCL 5 MG PO TABS: 5.0000 mg | ORAL_TABLET | Freq: Four times a day (QID) | ORAL | 0 refills | Status: DC | PRN

## 2022-05-17 MED ORDER — OXYCODONE HCL 5 MG PO TABS
5.0000 mg | ORAL_TABLET | ORAL | Status: DC | PRN
Start: 2022-05-17 — End: 2022-05-17

## 2022-05-17 MED ORDER — URSODIOL 300 MG PO CAPS: 300.0000 mg | ORAL_CAPSULE | Freq: Two times a day (BID) | ORAL | 0 refills | Status: DC

## 2022-05-17 MED ORDER — SODIUM CHLORIDE 0.9 % IV BOLUS
500.0000 mL | Freq: Once | INTRAVENOUS | Status: AC
Start: 2022-05-17 — End: 2022-05-17
  Administered 2022-05-17: 500 mL via INTRAVENOUS

## 2022-05-17 MED ORDER — ONDANSETRON 8 MG PO TBDP: 8.0000 mg | ORAL_TABLET | Freq: Three times a day (TID) | ORAL | 0 refills | Status: DC | PRN

## 2022-05-17 MED ORDER — PANTOPRAZOLE SODIUM 40 MG PO TBEC: 40.0000 mg | DELAYED_RELEASE_TABLET | Freq: Two times a day (BID) | ORAL | 1 refills | Status: DC

## 2022-05-17 MED ORDER — SIMETHICONE 80 MG PO CHEW: 80.0000 mg | CHEWABLE_TABLET | Freq: Four times a day (QID) | ORAL | 2 refills | Status: DC | PRN

## 2022-05-17 NOTE — Progress Notes (Signed)
Nsg Discharge Note  Admit Date:  05/14/2022 Discharge date: 05/17/2022   Erie Noe to be D/C'd Home per MD order.  AVS completed.  Copy for chart, and copy for patient signed, and dated. Patient/caregiver able to verbalize understanding.  Discharge Medication: Allergies as of 05/17/2022   No Known Allergies      Medication List     STOP taking these medications    ibuprofen 800 MG tablet Commonly known as: ADVIL       TAKE these medications    ondansetron 8 MG disintegrating tablet Commonly known as: Zofran ODT Take 1 tablet (8 mg total) by mouth every 8 (eight) hours as needed for nausea or vomiting.   oxyCODONE 5 MG immediate release tablet Commonly known as: Oxy IR/ROXICODONE Take 1 tablet (5 mg total) by mouth every 6 (six) hours as needed for breakthrough pain or severe pain.   pantoprazole 40 MG tablet Commonly known as: PROTONIX Take 1 tablet (40 mg total) by mouth 2 (two) times daily. What changed: when to take this   simethicone 80 MG chewable tablet Commonly known as: Gas-X Chew 1 tablet (80 mg total) by mouth 4 (four) times daily as needed for flatulence.   ursodiol 300 MG capsule Commonly known as: ACTIGALL Take 1 capsule (300 mg total) by mouth 2 (two) times daily.        Discharge Assessment: Vitals:   05/17/22 0022 05/17/22 0241  BP: (!) 88/56 (!) 92/56  Pulse:  64  Resp:  18  Temp:    SpO2:  97%   Skin clean, dry and intact without evidence of skin break down, no evidence of skin tears noted. IV catheter discontinued intact. Site without signs and symptoms of complications - no redness or edema noted at insertion site, patient denies c/o pain - only slight tenderness at site.  Dressing with slight pressure applied.  D/c Instructions-Education: Discharge instructions given to patient/family with verbalized understanding. D/c education completed with patient/family including follow up instructions, medication list, d/c  activities limitations if indicated, with other d/c instructions as indicated by MD - patient able to verbalize understanding, all questions fully answered. Patient instructed to return to ED, call 911, or call MD for any changes in condition.  Patient escorted via [place], and D/C home via private auto.  Clovis Fredrickson, LPN 0/09/2724 3:66 PM

## 2022-05-17 NOTE — Progress Notes (Signed)
Gastroenterology Progress Note   Referring Provider: No ref. provider found Primary Care Physician:  Raiford Simmonds., PA-C Primary Gastroenterologist:  Dr. Laural Golden  Patient ID: Bonnie Fleming; [id number]; [birth year]    Subjective   Patient doing better today. Tolerated clear liquids okay without nausea or vomiting. Only reported some gas related to the chicken broth. Passing flatus, no BM yet. Denies abdominal pain.    Objective   Vital signs in last 24 hours Temp:  [97.6 F (36.4 C)-98.7 F (37.1 C)] 98.7 F (37.1 C) (05/18 0016) Pulse Rate:  [64-85] 64 (05/18 0241) Resp:  [15-25] 18 (05/18 0241) BP: (88-127)/(7-81) 92/56 (05/18 0241) SpO2:  [97 %-100 %] 97 % (05/18 0241) Last BM Date : 05/15/22  Physical Exam General:   Alert and oriented, pleasant Head:  Normocephalic and atraumatic. Eyes:  minimum icterus. Conjuctiva pink.  Heart:  S1, S2 present, no murmurs noted.  Lungs: Clear to auscultation bilaterally, without wheezing, rales, or rhonchi.  Abdomen:  Bowel sounds present, soft, mild tenderness to periumbilical region/epigastrium, non-distended. No HSM or hernias noted. No rebound or guarding. No masses appreciated  Extremities:  Without clubbing or edema. Neurologic:  Alert and  oriented x4;  grossly normal neurologically. Skin:  Warm and dry, intact without significant lesions.  Psych:  Alert and cooperative. Normal mood and affect.  Intake/Output from previous day: 05/17 0701 - 05/18 0700 In: 800 [I.V.:700; IV Piggyback:100] Out: -  Intake/Output this shift: No intake/output data recorded.  Lab Results  Recent Labs    05/14/22 1311 05/15/22 0549 05/17/22 0613  WBC 10.4 8.3 11.3*  HGB 13.7 11.8* 11.4*  HCT 40.0 36.2 33.6*  PLT 315 258 255   BMET Recent Labs    05/14/22 1311 05/15/22 0549 05/17/22 0613  NA 137 138 139  K 3.7 3.5 3.8  CL 106 110 111  CO2 23 21* 19*  GLUCOSE 122* 114* 119*  BUN '9 6 9  ' CREATININE 0.40* 0.34* 0.47   CALCIUM 9.5 9.0 8.7*   LFT Recent Labs    05/15/22 0549 05/16/22 0616 05/17/22 0613  PROT 7.0 7.0 6.7  ALBUMIN 4.0 3.8 3.5  AST 285* 166* 74*  ALT 471* 354* 252*  ALKPHOS 450* 489* 408*  BILITOT 5.5* 2.5* 1.5*  BILIDIR  --  0.8*  --   IBILI  --  1.7*  --    PT/INR No results for input(s): LABPROT, INR in the last 72 hours. Hepatitis Panel Recent Labs    05/14/22 2107  HEPBSAG NON REACTIVE  HCVAB NON REACTIVE  HEPAIGM NON REACTIVE  HEPBIGM NON REACTIVE    Studies/Results CT ABDOMEN PELVIS W CONTRAST  Result Date: 05/14/2022 CLINICAL DATA:  Abdominal pain EXAM: CT ABDOMEN AND PELVIS WITH CONTRAST TECHNIQUE: Multidetector CT imaging of the abdomen and pelvis was performed using the standard protocol following bolus administration of intravenous contrast. RADIATION DOSE REDUCTION: This exam was performed according to the departmental dose-optimization program which includes automated exposure control, adjustment of the mA and/or kV according to patient size and/or use of iterative reconstruction technique. CONTRAST:  53m OMNIPAQUE IOHEXOL 350 MG/ML SOLN COMPARISON:  05/15/2016 FINDINGS: Motion degraded images. Lower chest: Lung bases are essentially clear. Hepatobiliary: Liver is within normal limits. Status post cholecystectomy. Mild intrahepatic and moderate extrahepatic ductal dilatation, new from prior. Common duct measures with 2.0 cm at its midportion. No choledocholithiasis is evident on CT. Pancreas: Within normal limits. Specifically, no obstructing pancreatic head mass or ductal dilatation. Spleen: Within normal limits. Adrenals/Urinary Tract:  Adrenal glands are within normal limits. Subcentimeter bilateral renal cysts.  No hydronephrosis. Bladder is within normal limits. Stomach/Bowel: Stomach is within normal limits. No evidence of bowel obstruction. Normal appendix (series 2/image 72). No colonic wall thickening or inflammatory changes. Vascular/Lymphatic: No evidence of  abdominal aortic aneurysm. No suspicious abdominopelvic lymphadenopathy. Reproductive: Uterine fibroids. Left ovary is within normal limits.  No right adnexal mass. Other: No abdominopelvic ascites. Musculoskeletal: Visualized osseous structures are within normal limits. IMPRESSION: Status post cholecystectomy. Mild intrahepatic and moderate extrahepatic ductal dilatation, new from prior. Common duct measures 2 cm. No choledocholithiasis or obstructing lesion is evident on CT, favoring distal CBD/ampullary stricture. ERCP is suggested. Electronically Signed   By: Julian Hy M.D.   On: 05/14/2022 21:38   DG ERCP  Result Date: 05/16/2022 CLINICAL DATA:  Common bile duct stone. EXAM: ERCP TECHNIQUE: Multiple spot images obtained with the fluoroscopic device and submitted for interpretation post-procedure. FLUOROSCOPY TIME: FLUOROSCOPY TIME 4 minutes, 10 seconds (66 mGy) COMPARISON:  CT abdomen and pelvis-05/14/2022 FINDINGS: Multiple images of the right upper abdominal quadrant during ERCP are provided for review Initial image demonstrates an ERCP probe overlying the right upper abdominal quadrant. Cholecystectomy clips overlies expected location of the gallbladder fossa There is selective cannulation and opacification of the common bile duct which appears moderate to markedly dilated. There is a large nearly occlusive filling defect in the distal aspect of the CBD worrisome for choledocholithiasis. Several additional filling defects are seen within the proximal aspect of the CBD at the level of the hilum potentially representative of additional choledocholithiasis versus air bubbles There is insufflation of a balloon within the central aspect of the CBD with subsequent biliary sweeping and presumed sphincterotomy. Subsequent images demonstrate utilization of a cage device for presumed stone extraction. Completion image demonstrates placement of an internal biliary stent overlying expected location of the  CBD. There is minimal opacification of the intrahepatic biliary tree which appears mildly dilated. There is no definitive opacification of either the pancreatic or residual cystic ducts. IMPRESSION: ERCP with findings suggestive of choledocholithiasis with subsequent biliary sweeping, sphincterotomy and presumed stone extraction with biliary stent placement. These images were submitted for radiologic interpretation only. Please see the procedural report for the amount of contrast and the fluoroscopy time utilized. Electronically Signed   By: Sandi Mariscal M.D.   On: 05/16/2022 14:21    Assessment  51 y.o. female with no significant past medical history who presented to the ED on 5/15 with epigastric pain, nausea without vomiting, and mild scleral icterus. Labs revealed acute on chronic elevated LFTs and elevated bilirubin. CT A/P with mild intrahepatic and extrahepatic ductal dilation with CBD measuring 2 cm at midpoint without obvious choledocholithiasis or obstructing lesion favoring CBD/ampullary stricture with recommendation for ERCP.   Elevated LFTs with dilated CBD on CT: Mildly elevated LFTs dating back to 2017 with AST 106, ALT 77. Last year LFTs were AST 74, ALT 120, alk phos 249. On admission AST 422, ALT 500, alk phos 472, T bili 3.5. Bilirubin peaked at 5.5 and LFTs improved. CT was concerning for CBD/ampullary stricture with biliary dilation with normal pancreas and liver. History of biliary dilation post cholecystectomy, but CBD previously measured 1.2 cmin 2017, now measuring 2 cm. Tylenol level and alcohol levels normal. Denies alcohol abuse, illicit drug use, and frequent tylenol use.  She denied any fmaily history of liver disease. She underwent ERCP 5/17 with noraml papilla, filling defect with large CBD stone and duct with mechanical lithotripsy, many  fragments removed, unable to retrieve all stones to clear the duct, plastic biliary stent placed. Labs today have improved - AST 74, ALT 252,  Alk phos 408, T Bili 1.5, hepatitis panel negative, Hgb stable at 11.4. Abdominal exam presents mild tenderness to periumbilical region. Scleral icterus minimum. She has been tolerating increase in diet, will advance to fulls and can be discharged once tolerating diet. She was started on ursodiol for gallstone dissolution and prevention and should continue this. She will need repeat ERCP in 3-4 weeks.   Epigastric abdominal pain: Intermittent epigastric pain since March 2023, postprandial in nature and triggered by spicy and greasy foods. Worsening symptoms occurred over the week prior to admission. Reported that pain had radiated to her chest. She denies history of GERD or dysphagia. Denies melena or BRBPR. She was started on oral Protonix outpatient by PCP. She admitted to 800 mg ibuprofen 3 days a week for several months for back and abdominal pain. She underwent EGD/ERCP on 5/17 with EGD revealing gastritis, biopsy taken, otherwise normal. She should avoid NSAIDs, continue PPI and will f/u pathology.    Plan / Recommendations  Avoid NSAIDs Advance diet to full liquids, if tolerates than can D/C home on low fat diet. Ursodiol 500 mg BID Continue PPI BID Continue to trend LFTs Follow up gastritis biopsy Follow up outpatient.  Repeat ERCP in 3-4 weeks.      LOS: 3 days    05/17/2022, 11:09 AM   Venetia Night, MSN, FNP-BC, AGACNP-BC [facility]

## 2022-05-17 NOTE — Telephone Encounter (Signed)
[name] - can you please arrange a hospital follow up for Mrs. Bonnie Fleming. She saw Dr. Laural Golden and had an ERCP with gallstones.  [name] - per Dr. Laural Golden this patient needs a repeat ERCP in 3-4 weeks due to dilated CBD and choledocholithiasis.   Venetia Night, MSN, FNP-BC, AGACNP-BC [facility]

## 2022-05-17 NOTE — Telephone Encounter (Signed)
Bonnie Fleming per dr Laural Golden he would like patient seen 2nd week of June in office and he will put in orders at her visit.  He also would like LFT"s prior to visit and I will put in reminder file to call her when its close to her appt and let her know to do labs.

## 2022-05-17 NOTE — Telephone Encounter (Signed)
Patient called states she got out of hospital today and [facility] is out of stock for oxycodone rx that hospitalist sent in. I called [facility] and they are out of stock and not sure when they will get any more in stock. Dr Laural Golden called dr Manuella Ghazi who was the provider that wrote rx and he did send to [facility] [address]. I called patient and let her know and I called [facility] and canceled rx since pt is requesting at [facility] [address].

## 2022-05-17 NOTE — Discharge Summary (Signed)
Physician Discharge Summary  Bonnie Fleming ZOX:[id number] DOB: 11/14/1971 DOA: 05/14/2022  PCP: Royce Macadamia D., PA-C  Admit date: 05/14/2022  Discharge date: 05/17/2022  Admitted From:Home  Disposition:  Home  Recommendations for Outpatient Follow-up:  Follow up with PCP in 1-2 weeks Follow-up with gastroenterology which will be scheduled in 3-4 weeks for repeat ERCP Continue PPI twice daily Continue ursodiol twice daily Pain medications with oxycodone for 10 tablets and 0 refills prescribed Refilled Zofran for nausea and vomiting Simethicone prescribed for any gas/bloating Recommend low-fat diet moving forward  Home Health: None  Equipment/Devices: None  Discharge Condition:Stable  CODE STATUS: Full  Diet recommendation: Low-fat diet  Brief/Interim Summary:  51 year old female with a history of GERD, admitted to the hospital with epigastric abdominal pain and nausea.  Noted to have elevated liver enzymes as well as hyperbilirubinemia.  Imaging indicated dilated biliary ducts.  GI following.  Patient has undergone ERCP 5/17 with biliary sphincterotomy and removal of CBD stones and subsequent placement of stent.  She is now tolerating her full liquid diet and has been recommended to remain on a low-fat diet per GI.  She will also remain on medications as noted above and be followed up in 3-4 weeks for repeat ERCP.  No other acute events noted throughout the course of the stay.  Discharge Diagnoses:  Principal Problem:   Obstructive jaundice Active Problems:   GERD (gastroesophageal reflux disease)   Elevated LFTs   Epigastric pain   Dilated cbd, acquired   Intrahepatic bile duct dilation  Principal discharge diagnosis: Transaminitis secondary to obstructive jaundice status post ERCP with sphincterotomy and mechanical lithotripsy with placement of biliary stent.  Discharge Instructions  Discharge Instructions     Diet - low sodium heart healthy   Complete  by: As directed    Increase activity slowly   Complete by: As directed       Allergies as of 05/17/2022   No Known Allergies      Medication List     STOP taking these medications    ibuprofen 800 MG tablet Commonly known as: ADVIL       TAKE these medications    ondansetron 8 MG disintegrating tablet Commonly known as: Zofran ODT Take 1 tablet (8 mg total) by mouth every 8 (eight) hours as needed for nausea or vomiting.   oxyCODONE 5 MG immediate release tablet Commonly known as: Oxy IR/ROXICODONE Take 1 tablet (5 mg total) by mouth every 6 (six) hours as needed for breakthrough pain or severe pain.   pantoprazole 40 MG tablet Commonly known as: PROTONIX Take 1 tablet (40 mg total) by mouth 2 (two) times daily. What changed: when to take this   simethicone 80 MG chewable tablet Commonly known as: Gas-X Chew 1 tablet (80 mg total) by mouth 4 (four) times daily as needed for flatulence.   ursodiol 300 MG capsule Commonly known as: ACTIGALL Take 1 capsule (300 mg total) by mouth 2 (two) times daily.        Follow-up Information     Muse, Noel Journey., PA-C. Schedule an appointment as soon as possible for a visit in 4 week(s).   Contact information: [address] [place] [address] [phone]         [facility]. Go in 4 week(s).   Contact information: [address] [place] [phone]               No Known Allergies  Consultations:  GI   Procedures/Studies: CT ABDOMEN PELVIS W CONTRAST  Result Date: 05/14/2022 CLINICAL DATA:  Abdominal pain EXAM: CT ABDOMEN AND PELVIS WITH CONTRAST TECHNIQUE: Multidetector CT imaging of the abdomen and pelvis was performed using the standard protocol following bolus administration of intravenous contrast. RADIATION DOSE REDUCTION: This exam was performed according to the departmental dose-optimization program which includes automated  exposure control, adjustment of the mA and/or kV according to patient size and/or use of iterative reconstruction technique. CONTRAST:  35m OMNIPAQUE IOHEXOL 350 MG/ML SOLN COMPARISON:  05/15/2016 FINDINGS: Motion degraded images. Lower chest: Lung bases are essentially clear. Hepatobiliary: Liver is within normal limits. Status post cholecystectomy. Mild intrahepatic and moderate extrahepatic ductal dilatation, new from prior. Common duct measures with 2.0 cm at its midportion. No choledocholithiasis is evident on CT. Pancreas: Within normal limits. Specifically, no obstructing pancreatic head mass or ductal dilatation. Spleen: Within normal limits. Adrenals/Urinary Tract: Adrenal glands are within normal limits. Subcentimeter bilateral renal cysts.  No hydronephrosis. Bladder is within normal limits. Stomach/Bowel: Stomach is within normal limits. No evidence of bowel obstruction. Normal appendix (series 2/image 72). No colonic wall thickening or inflammatory changes. Vascular/Lymphatic: No evidence of abdominal aortic aneurysm. No suspicious abdominopelvic lymphadenopathy. Reproductive: Uterine fibroids. Left ovary is within normal limits.  No right adnexal mass. Other: No abdominopelvic ascites. Musculoskeletal: Visualized osseous structures are within normal limits. IMPRESSION: Status post cholecystectomy. Mild intrahepatic and moderate extrahepatic ductal dilatation, new from prior. Common duct measures 2 cm. No choledocholithiasis or obstructing lesion is evident on CT, favoring distal CBD/ampullary stricture. ERCP is suggested. Electronically Signed   By: SJulian HyM.D.   On: 05/14/2022 21:38   DG ERCP  Result Date: 05/16/2022 CLINICAL DATA:  Common bile duct stone. EXAM: ERCP TECHNIQUE: Multiple spot images obtained with the fluoroscopic device and submitted for interpretation post-procedure. FLUOROSCOPY TIME: FLUOROSCOPY TIME 4 minutes, 10 seconds (66 mGy) COMPARISON:  CT abdomen and  pelvis-05/14/2022 FINDINGS: Multiple images of the right upper abdominal quadrant during ERCP are provided for review Initial image demonstrates an ERCP probe overlying the right upper abdominal quadrant. Cholecystectomy clips overlies expected location of the gallbladder fossa There is selective cannulation and opacification of the common bile duct which appears moderate to markedly dilated. There is a large nearly occlusive filling defect in the distal aspect of the CBD worrisome for choledocholithiasis. Several additional filling defects are seen within the proximal aspect of the CBD at the level of the hilum potentially representative of additional choledocholithiasis versus air bubbles There is insufflation of a balloon within the central aspect of the CBD with subsequent biliary sweeping and presumed sphincterotomy. Subsequent images demonstrate utilization of a cage device for presumed stone extraction. Completion image demonstrates placement of an internal biliary stent overlying expected location of the CBD. There is minimal opacification of the intrahepatic biliary tree which appears mildly dilated. There is no definitive opacification of either the pancreatic or residual cystic ducts. IMPRESSION: ERCP with findings suggestive of choledocholithiasis with subsequent biliary sweeping, sphincterotomy and presumed stone extraction with biliary stent placement. These images were submitted for radiologic interpretation only. Please see the procedural report for the amount of contrast and the fluoroscopy time utilized. Electronically Signed   By: JSandi MariscalM.D.   On: 05/16/2022 14:21     Discharge Exam: Vitals:   05/17/22 0022 05/17/22 0241  BP: (!) 88/56 (!) 92/56  Pulse:  64  Resp:  18  Temp:    SpO2:  97%   Vitals:   05/16/22  2040 05/17/22 0016 05/17/22 0022 05/17/22 0241  BP: 116/72 (!) 88/57 (!) 88/56 (!) 92/56  Pulse: 78 73  64  Resp: '18 17  18  '$ Temp: 98 F (36.7 C) 98.7 F (37.1 C)     TempSrc:    Oral  SpO2: 97% 97%  97%  Weight:      Height:        General: Pt is alert, awake, not in acute distress Cardiovascular: RRR, S1/S2 +, no rubs, no gallops Respiratory: CTA bilaterally, no wheezing, no rhonchi Abdominal: Soft, NT, ND, bowel sounds + Extremities: no edema, no cyanosis    The results of significant diagnostics from this hospitalization (including imaging, microbiology, ancillary and laboratory) are listed below for reference.     Microbiology: No results found for this or any previous visit (from the past 240 hour(s)).   Labs: BNP (last 3 results) No results for input(s): BNP in the last 8760 hours. Basic Metabolic Panel: Recent Labs  Lab 05/14/22 1311 05/15/22 0549 05/17/22 0613  NA 137 138 139  K 3.7 3.5 3.8  CL 106 110 111  CO2 23 21* 19*  GLUCOSE 122* 114* 119*  BUN '9 6 9  '$ CREATININE 0.40* 0.34* 0.47  CALCIUM 9.5 9.0 8.7*   Liver Function Tests: Recent Labs  Lab 05/14/22 1311 05/15/22 0549 05/16/22 0616 05/17/22 0613  AST 422* 285* 166* 74*  ALT 500* 471* 354* 252*  ALKPHOS 472* 450* 489* 408*  BILITOT 3.5* 5.5* 2.5* 1.5*  PROT 8.4* 7.0 7.0 6.7  ALBUMIN 4.5 4.0 3.8 3.5   Recent Labs  Lab 05/14/22 1311 05/17/22 0613  LIPASE 34  --   AMYLASE  --  59   Recent Labs  Lab 05/14/22 2127  AMMONIA 37*   CBC: Recent Labs  Lab 05/14/22 1311 05/15/22 0549 05/17/22 0613  WBC 10.4 8.3 11.3*  HGB 13.7 11.8* 11.4*  HCT 40.0 36.2 33.6*  MCV 93.7 93.5 93.3  PLT 315 258 255   Cardiac Enzymes: No results for input(s): CKTOTAL, CKMB, CKMBINDEX, TROPONINI in the last 168 hours. BNP: Invalid input(s): POCBNP CBG: No results for input(s): GLUCAP in the last 168 hours. D-Dimer No results for input(s): DDIMER in the last 72 hours. Hgb A1c No results for input(s): HGBA1C in the last 72 hours. Lipid Profile No results for input(s): CHOL, HDL, LDLCALC, TRIG, CHOLHDL, LDLDIRECT in the last 72 hours. Thyroid function studies No  results for input(s): TSH, T4TOTAL, T3FREE, THYROIDAB in the last 72 hours.  Invalid input(s): FREET3 Anemia work up No results for input(s): VITAMINB12, FOLATE, FERRITIN, TIBC, IRON, RETICCTPCT in the last 72 hours. Urinalysis    Component Value Date/Time   COLORURINE AMBER (A) 05/14/2022 1721   APPEARANCEUR CLEAR 05/14/2022 1721   LABSPEC 1.009 05/14/2022 1721   PHURINE 6.0 05/14/2022 1721   GLUCOSEU NEGATIVE 05/14/2022 1721   HGBUR SMALL (A) 05/14/2022 1721   BILIRUBINUR SMALL (A) 05/14/2022 1721   KETONESUR NEGATIVE 05/14/2022 1721   PROTEINUR NEGATIVE 05/14/2022 1721   UROBILINOGEN 0.2 11/17/2010 1154   NITRITE NEGATIVE 05/14/2022 1721   LEUKOCYTESUR NEGATIVE 05/14/2022 1721   Sepsis Labs Invalid input(s): PROCALCITONIN,  WBC,  LACTICIDVEN Microbiology No results found for this or any previous visit (from the past 240 hour(s)).   Time coordinating discharge: 35 minutes  SIGNED:   Rodena Goldmann, DO [facility] 05/17/2022, 2:01 PM  If 7PM-7AM, please contact night-coverage [email or web address]

## 2022-05-18 ENCOUNTER — Other Ambulatory Visit (INDEPENDENT_AMBULATORY_CARE_PROVIDER_SITE_OTHER): Payer: Self-pay | Admitting: *Deleted

## 2022-05-24 ENCOUNTER — Other Ambulatory Visit (INDEPENDENT_AMBULATORY_CARE_PROVIDER_SITE_OTHER): Payer: Self-pay | Admitting: *Deleted

## 2022-05-24 ENCOUNTER — Other Ambulatory Visit (INDEPENDENT_AMBULATORY_CARE_PROVIDER_SITE_OTHER): Payer: Self-pay | Admitting: Internal Medicine

## 2022-05-24 ENCOUNTER — Telehealth (INDEPENDENT_AMBULATORY_CARE_PROVIDER_SITE_OTHER): Payer: Self-pay | Admitting: *Deleted

## 2022-05-24 DIAGNOSIS — R7989 Other specified abnormal findings of blood chemistry: Secondary | ICD-10-CM

## 2022-05-24 DIAGNOSIS — K838 Other specified diseases of biliary tract: Secondary | ICD-10-CM

## 2022-05-24 MED ORDER — BISMUTH SUBSALICYLATE 525 MG/30ML PO SUSP
ORAL | 0 refills | Status: DC
Start: 2022-05-24 — End: 2022-06-12

## 2022-05-24 MED ORDER — TETRACYCLINE HCL 500 MG PO CAPS: 500.0000 mg | ORAL_CAPSULE | Freq: Three times a day (TID) | ORAL | 0 refills | Status: DC

## 2022-05-24 MED ORDER — METRONIDAZOLE 500 MG PO TABS: ORAL_TABLET | ORAL | 0 refills | Status: DC

## 2022-05-24 MED ORDER — BISMUTH/METRONIDAZ/TETRACYCLIN 140-125-125 MG PO CAPS
3.0000 | ORAL_CAPSULE | Freq: Four times a day (QID) | ORAL | 0 refills | Status: DC
Start: 2022-05-24 — End: 2022-06-12

## 2022-05-24 NOTE — Telephone Encounter (Signed)
Insurance would not cover pylera. Per dr Laural Golden chagne to bismuth 2 tablespoons tid for 10 days,  Metronidazole '500mg'$  one tid for 10 days, tetracycline '500mg'$  one tid for 10 days. Follow up stool sample test in 2 months to see if infection has cleared. Called patient and discussed all and she verbalized understanding and meds sent to pharm.

## 2022-06-01 ENCOUNTER — Other Ambulatory Visit (INDEPENDENT_AMBULATORY_CARE_PROVIDER_SITE_OTHER): Payer: Self-pay | Admitting: *Deleted

## 2022-06-01 ENCOUNTER — Telehealth (INDEPENDENT_AMBULATORY_CARE_PROVIDER_SITE_OTHER): Payer: Self-pay | Admitting: *Deleted

## 2022-06-01 DIAGNOSIS — R7989 Other specified abnormal findings of blood chemistry: Secondary | ICD-10-CM

## 2022-06-01 NOTE — Telephone Encounter (Signed)
Patient aware to do labs for LFT's before visit on 6/13 and I faxed order over to [facility]

## 2022-06-01 NOTE — Telephone Encounter (Signed)
-----   Message from Carmelina Noun, LPN sent at 0/06/6225  8:03 AM EDT ----- Regarding: bw lft'S before appt in june

## 2022-06-11 ENCOUNTER — Other Ambulatory Visit (HOSPITAL_COMMUNITY)
Admission: RE | Admit: 2022-06-11 | Discharge: 2022-06-11 | Disposition: A | Discharge status: Home / Self Care | Payer: 59 | Source: Ambulatory Visit | Attending: Internal Medicine | Admitting: Internal Medicine

## 2022-06-11 DIAGNOSIS — R7989 Other specified abnormal findings of blood chemistry: Secondary | ICD-10-CM | POA: Diagnosis present

## 2022-06-11 LAB — HEPATIC FUNCTION PANEL
ALT: 18 U/L (ref 0–44)
AST: 21 U/L (ref 15–41)
Albumin: 4 g/dL (ref 3.5–5.0)
Alkaline Phosphatase: 222 U/L — ABNORMAL HIGH (ref 38–126)
Bilirubin, Direct: 0.2 mg/dL (ref 0.0–0.2)
Indirect Bilirubin: 0.6 mg/dL (ref 0.3–0.9)
Total Bilirubin: 0.8 mg/dL (ref 0.3–1.2)
Total Protein: 7.8 g/dL (ref 6.5–8.1)

## 2022-06-12 ENCOUNTER — Encounter (INDEPENDENT_AMBULATORY_CARE_PROVIDER_SITE_OTHER): Payer: Self-pay | Admitting: Internal Medicine

## 2022-06-12 ENCOUNTER — Ambulatory Visit (INDEPENDENT_AMBULATORY_CARE_PROVIDER_SITE_OTHER): Payer: 59 | Admitting: Internal Medicine

## 2022-06-12 VITALS — BP 121/79 | HR 93 | Temp 100.4°F | Ht 62.0 in | Wt 140.2 lb

## 2022-06-12 DIAGNOSIS — K2970 Gastritis, unspecified, without bleeding: Secondary | ICD-10-CM

## 2022-06-12 DIAGNOSIS — K219 Gastro-esophageal reflux disease without esophagitis: Secondary | ICD-10-CM

## 2022-06-12 DIAGNOSIS — B9681 Helicobacter pylori [H. pylori] as the cause of diseases classified elsewhere: Secondary | ICD-10-CM | POA: Diagnosis not present

## 2022-06-12 DIAGNOSIS — K8050 Calculus of bile duct without cholangitis or cholecystitis without obstruction: Secondary | ICD-10-CM

## 2022-06-12 MED ORDER — PANTOPRAZOLE SODIUM 40 MG PO TBEC: 40.0000 mg | DELAYED_RELEASE_TABLET | Freq: Every day | ORAL | 5 refills | Status: DC

## 2022-06-12 MED ORDER — URSODIOL 300 MG PO CAPS: 300.0000 mg | ORAL_CAPSULE | Freq: Two times a day (BID) | ORAL | 0 refills | Status: AC

## 2022-06-12 NOTE — Progress Notes (Signed)
Presenting complaint;  Follow-up for choledocholithiasis and H. pylori gastritis.  Database and subjective:  Bonnie Fleming is 51 year old Hispanic female who was hospitalized at [facility] for abdominal pain jaundice elevated transaminases and alkaline phosphatase.  She has had remote cholecystectomy.  CT revealed markedly dilated bile duct but no evidence of choledocholithiasis.  She was also complaining of frequent heartburn.  She underwent EGD and ERCP on 05/16/2022. EGD revealed H. pylori gastritis. ERCP revealed dilated CBD and CHD with large stone distally filling common bile duct and smaller stones in common hepatic duct.  Biliary sphincterotomy was performed.  Mechanical lithotripsy performed of large stone.  Multiple stone fragments were removed but bile duct was not clear.  Therefore 10 [place] plastic biliary stent was left in place. Regarding H. pylori gastritis she was begun on quadruple therapy on an outpatient basis.  She was also discharged on Urso given her history. Bonnie Fleming does not speak [place] and is accompanied by her daughter-in-law Colletta [place]. She feels much better.  Jaundice has resolved.  She has intermittent mild epigastric pain.  Heartburn is well controlled with therapy. She was not able to finish antibiotics because metronidazole made her dizzy.  She believes she took 5 days of medications. She denies nausea vomiting fever or chills.  She also denies right upper quadrant pain. Her appetite is good and her weight has been stable.   Current Medications: Outpatient Encounter Medications as of 06/12/2022  Medication Sig   Bismuth Subsalicylate 333 LK/56YB SUSP Take 2 tablespoons  tid for 10 days   pantoprazole (PROTONIX) 40 MG tablet Take 1 tablet (40 mg total) by mouth 2 (two) times daily.   simethicone (GAS-X) 80 MG chewable tablet Chew 1 tablet (80 mg total) by mouth 4 (four) times daily as needed for flatulence.   tetracycline (SUMYCIN) 500 MG capsule Take 1 capsule (500 mg total)  by mouth 3 (three) times daily.   ursodiol (ACTIGALL) 300 MG capsule Take 1 capsule (300 mg total) by mouth 2 (two) times daily.   Bismuth/Metronidaz/Tetracyclin (PYLERA) 140-125-125 MG CAPS Take 3 capsules by mouth 4 (four) times daily. (Bonnie Fleming not taking: Reported on 06/12/2022)   metroNIDAZOLE (FLAGYL) 500 MG tablet Take one tid for 10 days (Bonnie Fleming not taking: Reported on 06/12/2022)   ondansetron (ZOFRAN ODT) 8 MG disintegrating tablet Take 1 tablet (8 mg total) by mouth every 8 (eight) hours as needed for nausea or vomiting. (Bonnie Fleming not taking: Reported on 06/12/2022)   oxyCODONE (OXY IR/ROXICODONE) 5 MG immediate release tablet Take 1 tablet (5 mg total) by mouth every 6 (six) hours as needed for breakthrough pain or severe pain. (Bonnie Fleming not taking: Reported on 06/12/2022)   No facility-administered encounter medications on file as of 06/12/2022.     Objective: Blood pressure 121/79, pulse 93, temperature (!) 100.4 F (38 C), temperature source Oral, height _0  (1.575 m), weight 140 lb 3.2 oz (63.6 kg). Bonnie Fleming is alert and in no acute distress. Conjunctiva is pink. Sclera is nonicteric Oropharyngeal mucosa is normal. No neck masses or thyromegaly noted. Cardiac exam with regular rhythm normal S1 and S2. No murmur or gallop noted. Lungs are clear to auscultation. Abdomen is symmetrical and soft.  Mild midepigastric tenderness.  No organomegaly or masses. No LE edema or clubbing noted.  Labs/studies Results:      Latest Ref Rng & Units 05/17/2022    6:13 AM 05/15/2022    5:49 AM 05/14/2022    1:11 PM  CBC  WBC 4.0 - 10.5 K/uL 11.3  8.3  10.4   Hemoglobin 12.0 - 15.0 g/dL 11.4  11.8  13.7   Hematocrit 36.0 - 46.0 % 33.6  36.2  40.0   Platelets 150 - 400 K/uL 255  258  315        Latest Ref Rng & Units 06/11/2022    2:52 PM 05/17/2022    6:13 AM 05/16/2022    6:16 AM  CMP  Glucose 70 - 99 mg/dL  119    BUN 6 - 20 mg/dL  9    Creatinine 0.44 - 1.00 mg/dL  0.47    Sodium 135  - 145 mmol/L  139    Potassium 3.5 - 5.1 mmol/L  3.8    Chloride 98 - 111 mmol/L  111    CO2 22 - 32 mmol/L  19    Calcium 8.9 - 10.3 mg/dL  8.7    Total Protein 6.5 - 8.1 g/dL 7.8  6.7  7.0   Total Bilirubin 0.3 - 1.2 mg/dL 0.8  1.5  2.5   Alkaline Phos 38 - 126 U/L 222  408  489   AST 15 - 41 U/L 21  74  166   ALT 0 - 44 U/L 18  252  354        Latest Ref Rng & Units 06/11/2022    2:52 PM 05/17/2022    6:13 AM 05/16/2022    6:16 AM  Hepatic Function  Total Protein 6.5 - 8.1 g/dL 7.8  6.7  7.0   Albumin 3.5 - 5.0 g/dL 4.0  3.5  3.8   AST 15 - 41 U/L 21  74  166   ALT 0 - 44 U/L 18  252  354   Alk Phosphatase 38 - 126 U/L 222  408  489   Total Bilirubin 0.3 - 1.2 mg/dL 0.8  1.5  2.5   Bilirubin, Direct 0.0 - 0.2 mg/dL 0.2   0.8       Assessment:  #1.  Choledocholithiasis.  Bonnie Fleming underwent ERCP with sphincterotomy on 05/12/2022 when she was found to have large stone in common bile duct and smaller stones in common hepatic duct.  Biliary sphincterotomy performed along with mechanical lithotripsy.  Multiple stone fragments removed with bile duct could not be cleared of all the stones.  Therefore plastic stent left in place.  Bonnie Fleming is doing well.  Transaminases are normal and alkaline phosphatase has decreased from 489 to 222.  She is not having biliary symptoms anymore.  She will need another ERCP to remove biliary stent in remaining stones. Bonnie Fleming will remain on Urso until bile duct cleared of all stones. She will be referred to [facility] since I am retiring.  #2.  H. pylori gastritis.  She was treated with quadruple therapy.  She only took 5 days of therapy.  Remains to be seen if H. pylori infection has been eradicated.  #3.  GERD.  She is doing better with PPI.   Plan:  Continue pantoprazole at 40 mg p.o. nightly.  Prescription sent to pharmacy for 30 with 5 refills. Continue Urso at a dose of 3 mg p.o. twice daily.  Prescription sent for 60 doses with 5  refills. H. pylori serology in 6 weeks. We will contact Dr. Ardis Hughs office to arrange for ERCP near future. Office visit with Dr. Jenetta Downer in 2 months.

## 2022-06-12 NOTE — Patient Instructions (Signed)
Will arrange for referral to Dr. Ardis Hughs or Dr. Rush Landmark for repeat ERCP Stool test for H. Pyloria antigen in 6 weeks

## 2022-06-19 ENCOUNTER — Ambulatory Visit (INDEPENDENT_AMBULATORY_CARE_PROVIDER_SITE_OTHER): Payer: 59 | Admitting: Gastroenterology

## 2022-07-10 ENCOUNTER — Encounter (INDEPENDENT_AMBULATORY_CARE_PROVIDER_SITE_OTHER): Payer: Self-pay | Admitting: Gastroenterology

## 2022-07-20 ENCOUNTER — Other Ambulatory Visit (INDEPENDENT_AMBULATORY_CARE_PROVIDER_SITE_OTHER): Payer: Self-pay | Admitting: *Deleted

## 2022-07-24 ENCOUNTER — Other Ambulatory Visit (INDEPENDENT_AMBULATORY_CARE_PROVIDER_SITE_OTHER): Payer: Self-pay | Admitting: *Deleted

## 2022-07-24 DIAGNOSIS — B9681 Helicobacter pylori [H. pylori] as the cause of diseases classified elsewhere: Secondary | ICD-10-CM

## 2022-08-02 ENCOUNTER — Other Ambulatory Visit (INDEPENDENT_AMBULATORY_CARE_PROVIDER_SITE_OTHER): Payer: Self-pay | Admitting: *Deleted

## 2022-08-02 ENCOUNTER — Telehealth (INDEPENDENT_AMBULATORY_CARE_PROVIDER_SITE_OTHER): Payer: Self-pay | Admitting: *Deleted

## 2022-08-02 DIAGNOSIS — B9681 Helicobacter pylori [H. pylori] as the cause of diseases classified elsewhere: Secondary | ICD-10-CM

## 2022-08-02 NOTE — Telephone Encounter (Signed)
Patient was in reminder file - Patient needs follow up stool test to see infection has cleared up - 11month after treatment. Patient had pylera called in on 05/24/22. Treatment is for 10 days  I called patient and let her know she was due to have stool test done next week and she states she will go to lab and pick up stool containers to collect specimen. [facility].

## 2022-08-23 ENCOUNTER — Encounter (INDEPENDENT_AMBULATORY_CARE_PROVIDER_SITE_OTHER): Payer: Self-pay | Admitting: Gastroenterology

## 2022-08-23 ENCOUNTER — Ambulatory Visit (INDEPENDENT_AMBULATORY_CARE_PROVIDER_SITE_OTHER): Payer: 59 | Admitting: Gastroenterology

## 2022-08-23 VITALS — BP 117/82 | HR 76 | Temp 98.1°F | Ht 62.0 in | Wt 141.9 lb

## 2022-08-23 DIAGNOSIS — K8050 Calculus of bile duct without cholangitis or cholecystitis without obstruction: Secondary | ICD-10-CM

## 2022-08-23 DIAGNOSIS — K2970 Gastritis, unspecified, without bleeding: Secondary | ICD-10-CM

## 2022-08-23 DIAGNOSIS — B9681 Helicobacter pylori [H. pylori] as the cause of diseases classified elsewhere: Secondary | ICD-10-CM

## 2022-08-23 DIAGNOSIS — K219 Gastro-esophageal reflux disease without esophagitis: Secondary | ICD-10-CM

## 2022-08-23 MED ORDER — URSODIOL 300 MG PO CAPS
300.0000 mg | ORAL_CAPSULE | Freq: Two times a day (BID) | ORAL | 3 refills | Status: DC
Start: 2022-08-23 — End: 2022-12-03

## 2022-08-23 NOTE — Patient Instructions (Signed)
We will check labs to determine if the h pylori infection in your stomach is gone and to make sure liver function looks good Please continue Urso twice a day and pantoprazole once daily before meals We will get you referred back to [facility] as you need a repeat ERCP to remove the biliary stent placed by Dr. Laural Golden in May  Follow up in 3 months

## 2022-08-23 NOTE — Progress Notes (Signed)
Referring Provider: Raiford Simmonds., PA-C Primary Care Physician:  Raiford Simmonds., PA-C Primary GI Physician: previously rehman  Chief Complaint  Patient presents with   Gastroesophageal Reflux    Patient arrives with daughter in law - Bonnie Fleming. Follow up on choledocholitiasis, h pylori and GERD. Reports she is doing better. Sometimes has reflux symptoms. Not often.     HPI:   Bonnie Fleming is a 51 y.o. female with past medical history of H pylori gastritis and choledocolithiasis.   Patient presenting today for follow up of GERD and H pylori.  History: hospitalized at [facility] for abdominal pain jaundice elevated transaminases and alkaline phosphatase. She has had remote cholecystectomy.  CT revealed markedly dilated bile duct but no evidence of choledocholithiasis.  She was also complaining of frequent heartburn.   She underwent EGD and ERCP on 05/16/2022. EGD revealed H. pylori gastritis. ERCP revealed dilated CBD and CHD with large stone distally filling common bile duct and smaller stones in common hepatic duct.  Biliary sphincterotomy was performed.  Mechanical lithotripsy performed of large stone.  Multiple stone fragments were removed but bile duct was not clear. Therefore 10 [place] plastic biliary stent was left in place.Regarding H. pylori gastritis she was begun on quadruple therapy on an outpatient basis.  She was also discharged on Urso given her history  Last seen June 2023  Doing well at that time, recommended referral to [facility] for repeat ERCP to remove biliary stent and remaining stones, H pylori serologies in 6 weeks, continue urso 25m BID and PPI daily. H pylori series was not completed. Repeat ERCP has also not been completed  Last labs in June with AST 21, ALT 18, Alk PHos 222, T bili 0.8  Present: History is provided with assistance/interpretation from patient's daughter in law Bonnie Fleming as patient is primarily [place]. Patient reports doing well  today. Has occasional reflux symptoms but not often. She is taking Protonix 4102mdaily. denies abdominal pain, nausea, vomiting, changes to skin color or itching. Appetite is good and she has no weight loss. She states that she has never been contacted by [facility] to have repeat ERCP. She did not do previous h pylori testing as she thought she had to have an appt for that. She has not had any recent labs. She is only taking Urso once daily as she did not understand it was to be dosed BID. No red flag symptoms. Patient denies melena, hematochezia, nausea, vomiting, diarrhea, constipation, dysphagia, odyonophagia, early satiety or weight loss.   Last Colonoscopy:?   Last Endoscopy:- Normal esophagus. - Normal stomach. - Normal examined duodenum. - Normal esophagus. - Z-line regular, 39 cm from the incisors. - Normal cardia, gastric fundus and gastric body. - Gastritis. Biopsied.(H pylori, treated with pylera) - Normal duodenal bulb, second portion of the duodenum and major papilla. Ercp:05/16/22 - The major papilla appeared normal with short intramural segment limiting size of sphincterotomy - A filling defect consistent with stone stones including large stone and common bile duct was seen on the cholangiogram. - Biliary sphincterotomy performed. Sphincterotomy dilated to 10 mm with TTS balloon. - Large stone broken into smaller pieces with mechanical lithotripsy. Multiple fragments were removed with Damia basket and stone balloon extractor. - Bile duct could not be cleared of all the stones. - One plastic stent was placed into the common bile duct.  Recommendations:    No past medical history on file.  Past Surgical History:  Procedure Laterality Date   BILIARY STENT PLACEMENT  N/A 05/16/2022   Procedure: BILIARY STENT PLACEMENT;  Surgeon: Rogene Houston, MD;  Location: [facility];  Service: Gastroenterology;  Laterality: N/A;   CHOLECYSTECTOMY  2005   ERCP N/A 05/16/2022   Procedure:  ENDOSCOPIC RETROGRADE CHOLANGIOPANCREATOGRAPHY (ERCP);  Surgeon: Rogene Houston, MD;  Location: [facility];  Service: Gastroenterology;  Laterality: N/A;   ESOPHAGOGASTRODUODENOSCOPY N/A 05/16/2022   Procedure: ESOPHAGOGASTRODUODENOSCOPY (EGD);  Surgeon: Rogene Houston, MD;  Location: [facility];  Service: Gastroenterology;  Laterality: N/A;   LAPAROSCOPIC UNILATERAL SALPINGO OOPHERECTOMY Right 05/18/2020   Procedure: LAPAROSCOPIC RIGHT SALPINGO OOPHORECTOMY;  Surgeon: Florian Buff, MD;  Location: [facility];  Service: Gynecology;  Laterality: Right;   LITHOTRIPSY N/A 05/16/2022   Procedure: MECHANICAL LITHOTRIPSY;  Surgeon: Rogene Houston, MD;  Location: [facility];  Service: Gastroenterology;  Laterality: N/A;   REMOVAL OF STONES N/A 05/16/2022   Procedure: REMOVAL OF STONES;  Surgeon: Rogene Houston, MD;  Location: [facility];  Service: Gastroenterology;  Laterality: N/A;   SPHINCTEROTOMY N/A 05/16/2022   Procedure: SPHINCTEROTOMY DILATED TO 10MM;  Surgeon: Rogene Houston, MD;  Location: [facility];  Service: Gastroenterology;  Laterality: N/A;    Current Outpatient Medications  Medication Sig Dispense Refill   pantoprazole (PROTONIX) 40 MG tablet Take 1 tablet (40 mg total) by mouth daily before breakfast. 30 tablet 5   ursodiol (ACTIGALL) 300 MG capsule Take 1 capsule by mouth 2 (two) times daily.     simethicone (GAS-X) 80 MG chewable tablet Chew 1 tablet (80 mg total) by mouth 4 (four) times daily as needed for flatulence. (Patient not taking: Reported on 08/23/2022) 100 tablet 2   No current facility-administered medications for this visit.    Allergies as of 08/23/2022   (No Known Allergies)    Family History  Problem Relation Age of Onset   Diabetes Father    Cancer - Colon Neg Hx    Liver disease Neg Hx     Social History   Socioeconomic History   Marital status: Divorced    Spouse name: Not on file   Number of children: Not on file   Years of education: Not on file   Highest  education level: Not on file  Occupational History   Not on file  Tobacco Use   Smoking status: Never    Passive exposure: Never   Smokeless tobacco: Never  Vaping Use   Vaping Use: Never used  Substance and Sexual Activity   Alcohol use: No   Drug use: No   Sexual activity: Yes    Birth control/protection: Surgical, Injection    Comment: Depo injection  Other Topics Concern   Not on file  Social History Narrative   Not on file   Social Determinants of Health   Financial Resource Strain: Medium Risk (04/22/2020)   Overall Financial Resource Strain (CARDIA)    Difficulty of Paying Living Expenses: Somewhat hard  Food Insecurity: No Food Insecurity (04/22/2020)   Hunger Vital Sign    Worried About Running Out of Food in the Last Year: Never true    Ran Out of Food in the Last Year: Never true  Transportation Needs: No Transportation Needs (04/22/2020)   PRAPARE - Hydrologist (Medical): No    Lack of Transportation (Non-Medical): No  Physical Activity: Insufficiently Active (04/22/2020)   Exercise Vital Sign    Days of Exercise per Week: 1 day    Minutes of Exercise per Session: 30 min  Stress: No  Stress Concern Present (04/22/2020)   [place]    Feeling of Stress : Not at all  Social Connections: Moderately Integrated (04/22/2020)   Social Connection and Isolation Panel [NHANES]    Frequency of Communication with Friends and Family: Three times a week    Frequency of Social Gatherings with Friends and Family: Never    Attends Religious Services: More than 4 times per year    Active Member of [employer] or Organizations: No    Attends Music therapist: Never    Marital Status: Living with partner   Review of systems General: negative for malaise, night sweats, fever, chills, weight loss Neck: Negative for lumps, goiter, pain and significant neck swelling Resp: Negative  for cough, wheezing, dyspnea at rest CV: Negative for chest pain, leg swelling, palpitations, orthopnea GI: denies melena, hematochezia, nausea, vomiting, diarrhea, constipation, dysphagia, odyonophagia, early satiety or unintentional weight loss.  MSK: Negative for joint pain or swelling, back pain, and muscle pain. Derm: Negative for itching or rash Psych: Denies depression, anxiety, memory loss, confusion. No homicidal or suicidal ideation.  Heme: Negative for prolonged bleeding, bruising easily, and swollen nodes. Endocrine: Negative for cold or heat intolerance, polyuria, polydipsia and goiter. Neuro: negative for tremor, gait imbalance, syncope and seizures. The remainder of the review of systems is noncontributory.  Physical Exam: BP 117/82 (BP Location: Left Arm, Patient Position: Sitting, Cuff Size: Normal)   Pulse 76   Temp 98.1 F (36.7 C) (Oral)   Ht $R'5\' 2"'jH$  (1.575 m)   Wt 141 lb 14.4 oz (64.4 kg)   BMI 25.95 kg/m  General:   Alert and oriented. No distress noted. Pleasant and cooperative.  Head:  Normocephalic and atraumatic. Eyes:  Conjuctiva clear without scleral icterus. Mouth:  Oral mucosa pink and moist. Good dentition. No lesions. Heart: Normal rate and rhythm, s1 and s2 heart sounds present.  Lungs: Clear lung sounds in all lobes. Respirations equal and unlabored. Abdomen:  +BS, soft, non-tender and non-distended. No rebound or guarding. No HSM or masses noted. Derm: No palmar erythema or jaundice Msk:  Symmetrical without gross deformities. Normal posture. Extremities:  Without edema. Neurologic:  Alert and  oriented x4 Psych:  Alert and cooperative. Normal mood and affect.  Invalid input(s): "6 MONTHS"   ASSESSMENT: Bonnie Fleming is a 51 y.o. female presenting today for follow up.  Previous hospitalization in May at [facility] for abdominal pain jaundice elevated transaminases and alkaline phosphatase. EGD with h pylori gastritis, treated with pylera. ERCP  with large stone in distal CBD, smaller stones in CHD. Plastic biliary stent placed. Started on Urso $Remov'500mg'AWVqhl$  BID, patient currently only taking this once daily, advised to take BID. Patient was supposed to have repeat ERCP at [facility] after being seen here by Dr. Laural Golden in June, however, they have not reached out to the patient yet to get this scheduled. Discussed referring [facility] as they may be able to get her scheduled for ERCP sooner, however, patient does not want to go to [facility]. Will attempt to send referral again, ideally needs to be done within the next 2 months. She denies any current abdominal pain, nausea, vomiting, jaundice, itching or changes in weight or appetite.   In regards to H pylori, she was lost to follow up for post treatment testing. Will order H pylori breath test. Patient should be off of her protonix x2 weeks prior to completing this.   Last labs in June  with AST 21, ALT 18, Alk PHos 222, T bili 0.8. will recheck LFTs today.   PLAN:  Check LFTs  2. Hold PPI x2 weeks prior to h pylori breath test, resume thereafter  3. Urso BID 4. Resend referral for ERCP to [facility]  All questions were answered, patient/patient's daughter in law verbalized understanding and are in agreement with plan as outlined above.   Follow Up: 3 months   Jamyria Ozanich L. Alver Sorrow, MSN, APRN, AGNP-C Adult-Gerontology Nurse Practitioner [facility]

## 2022-08-24 LAB — HEPATIC FUNCTION PANEL
ALT: 29 IU/L (ref 0–32)
AST: 23 IU/L (ref 0–40)
Albumin: 4.9 g/dL (ref 3.9–4.9)
Alkaline Phosphatase: 204 IU/L — ABNORMAL HIGH (ref 44–121)
Bilirubin Total: 0.6 mg/dL (ref 0.0–1.2)
Bilirubin, Direct: 0.16 mg/dL (ref 0.00–0.40)
Total Protein: 7.8 g/dL (ref 6.0–8.5)

## 2022-09-15 LAB — H. PYLORI BREATH TEST: H pylori Breath Test: NEGATIVE

## 2022-11-26 ENCOUNTER — Ambulatory Visit (INDEPENDENT_AMBULATORY_CARE_PROVIDER_SITE_OTHER): Payer: 59 | Admitting: Gastroenterology

## 2022-11-26 ENCOUNTER — Encounter (INDEPENDENT_AMBULATORY_CARE_PROVIDER_SITE_OTHER): Payer: Self-pay | Admitting: Gastroenterology

## 2022-11-30 ENCOUNTER — Other Ambulatory Visit (INDEPENDENT_AMBULATORY_CARE_PROVIDER_SITE_OTHER): Payer: Self-pay

## 2022-11-30 DIAGNOSIS — K831 Obstruction of bile duct: Secondary | ICD-10-CM

## 2022-11-30 DIAGNOSIS — K8050 Calculus of bile duct without cholangitis or cholecystitis without obstruction: Secondary | ICD-10-CM

## 2022-11-30 DIAGNOSIS — R7989 Other specified abnormal findings of blood chemistry: Secondary | ICD-10-CM

## 2022-11-30 DIAGNOSIS — K838 Other specified diseases of biliary tract: Secondary | ICD-10-CM

## 2022-11-30 DIAGNOSIS — K219 Gastro-esophageal reflux disease without esophagitis: Secondary | ICD-10-CM

## 2022-11-30 DIAGNOSIS — B9681 Helicobacter pylori [H. pylori] as the cause of diseases classified elsewhere: Secondary | ICD-10-CM

## 2022-12-03 ENCOUNTER — Other Ambulatory Visit (INDEPENDENT_AMBULATORY_CARE_PROVIDER_SITE_OTHER): Payer: Self-pay | Admitting: Gastroenterology

## 2022-12-03 MED ORDER — URSODIOL 300 MG PO CAPS: 300.0000 mg | ORAL_CAPSULE | Freq: Two times a day (BID) | ORAL | 1 refills | Status: DC

## 2022-12-03 NOTE — Progress Notes (Signed)
Yes, she needs urso. Thanks.

## 2023-01-03 ENCOUNTER — Other Ambulatory Visit: Payer: Self-pay

## 2023-01-31 ENCOUNTER — Ambulatory Visit (INDEPENDENT_AMBULATORY_CARE_PROVIDER_SITE_OTHER): Payer: PRIVATE HEALTH INSURANCE | Admitting: Gastroenterology

## 2023-01-31 ENCOUNTER — Encounter (INDEPENDENT_AMBULATORY_CARE_PROVIDER_SITE_OTHER): Payer: Self-pay | Admitting: Gastroenterology

## 2023-01-31 ENCOUNTER — Telehealth (INDEPENDENT_AMBULATORY_CARE_PROVIDER_SITE_OTHER): Payer: Self-pay | Admitting: Gastroenterology

## 2023-01-31 VITALS — BP 114/78 | HR 91 | Temp 99.5°F | Ht 62.0 in | Wt 146.1 lb

## 2023-01-31 DIAGNOSIS — K219 Gastro-esophageal reflux disease without esophagitis: Secondary | ICD-10-CM

## 2023-01-31 DIAGNOSIS — R1013 Epigastric pain: Secondary | ICD-10-CM

## 2023-01-31 DIAGNOSIS — K8050 Calculus of bile duct without cholangitis or cholecystitis without obstruction: Secondary | ICD-10-CM | POA: Diagnosis not present

## 2023-01-31 MED ORDER — PANTOPRAZOLE SODIUM 40 MG PO TBEC: 40.0000 mg | DELAYED_RELEASE_TABLET | Freq: Every day | ORAL | 3 refills | Status: DC

## 2023-01-31 NOTE — Progress Notes (Signed)
Referring Provider: Raiford Simmonds., PA-C Primary Care Physician:  Raiford Simmonds., PA-C Primary GI Physician: Jenetta Downer   Chief Complaint  Patient presents with   Gastroesophageal Reflux    Patient arrives with daughter Bonnie Fleming. Doing a follow up today. Reports feeling better. Does feel like she has a little pain in her stomach at times. Stopped taking ursodiol after ERCP. Has not been able to get protonix from pharmacy so has not had it but feels she needs it.    HPI:   Bonnie Fleming is a 52 y.o. female with past medical history of H pylori gastritis and choledocolithiasis.    Patient presenting today for follow up of GERD.  Last seen in August 2023, at that time doing well. occasional reflux symptoms but not often. She is taking Protonix '40mg'$  daily. she had not been contacted by [facility] to have repeat ERCP. She did not do previous h pylori testing. She is only taking Urso once daily as she did not understand it was to be dosed BID. No red flag symptoms.     Recommend checking LFTs, h pylori breath test off PPI, URSO BID, referral for ERCP (ERCP as outliend below)  H pylori testing in September was negative and LFTs normal.  Present:  Patient accompanied by daughter Bonnie Fleming who helps with translation. Patient notes that abdominal pain is much improved but has some occasional epigastric pain, usually improved with eating.  She is not taking her protonix as she was not sure she needed to continue this.  Denies heartburn or acid regurgitation. Appetite is good and weight is stabled. No dysphagia or odynophagia.   She is not taking urso anymore, she was also not aware that she needed to continue on this after her last ERCP in september. She denies any nausea or vomiting. Denies any pruritus or changes in skin color.   Denies issues with constipation, diarrhea, rectal bleeding, melena.    ERCP : 08/2022 Choledocholithiasis - this was treated with stent removal and balloon   sweeps with stones removal  Last Colonoscopy:?   Last Endoscopy:- Normal esophagus. - Normal stomach. - Normal examined duodenum. - Normal esophagus. - Z-line regular, 39 cm from the incisors. - Normal cardia, gastric fundus and gastric body. - Gastritis. Biopsied.(H pylori, treated with pylera) - Normal duodenal bulb, second portion of the duodenum and major papilla.   No past medical history on file.  Past Surgical History:  Procedure Laterality Date   BILIARY STENT PLACEMENT N/A 05/16/2022   Procedure: BILIARY STENT PLACEMENT;  Surgeon: Rogene Houston, MD;  Location: [facility];  Service: Gastroenterology;  Laterality: N/A;   CHOLECYSTECTOMY  2005   ERCP N/A 05/16/2022   Procedure: ENDOSCOPIC RETROGRADE CHOLANGIOPANCREATOGRAPHY (ERCP);  Surgeon: Rogene Houston, MD;  Location: [facility];  Service: Gastroenterology;  Laterality: N/A;   ESOPHAGOGASTRODUODENOSCOPY N/A 05/16/2022   Procedure: ESOPHAGOGASTRODUODENOSCOPY (EGD);  Surgeon: Rogene Houston, MD;  Location: [facility];  Service: Gastroenterology;  Laterality: N/A;   LAPAROSCOPIC UNILATERAL SALPINGO OOPHERECTOMY Right 05/18/2020   Procedure: LAPAROSCOPIC RIGHT SALPINGO OOPHORECTOMY;  Surgeon: Florian Buff, MD;  Location: [facility];  Service: Gynecology;  Laterality: Right;   LITHOTRIPSY N/A 05/16/2022   Procedure: MECHANICAL LITHOTRIPSY;  Surgeon: Rogene Houston, MD;  Location: [facility];  Service: Gastroenterology;  Laterality: N/A;   REMOVAL OF STONES N/A 05/16/2022   Procedure: REMOVAL OF STONES;  Surgeon: Rogene Houston, MD;  Location: [facility];  Service: Gastroenterology;  Laterality: N/A;   SPHINCTEROTOMY N/A 05/16/2022  Procedure: SPHINCTEROTOMY DILATED TO 10MM;  Surgeon: Rogene Houston, MD;  Location: [facility];  Service: Gastroenterology;  Laterality: N/A;    Current Outpatient Medications  Medication Sig Dispense Refill   pantoprazole (PROTONIX) 40 MG tablet Take 1 tablet (40 mg total) by mouth daily before breakfast. (Patient  not taking: Reported on 01/31/2023) 30 tablet 5   simethicone (GAS-X) 80 MG chewable tablet Chew 1 tablet (80 mg total) by mouth 4 (four) times daily as needed for flatulence. (Patient not taking: Reported on 08/23/2022) 100 tablet 2   ursodiol (ACTIGALL) 300 MG capsule Take 1 capsule (300 mg total) by mouth 2 (two) times daily. (Patient not taking: Reported on 01/31/2023) 180 capsule 1   No current facility-administered medications for this visit.    Allergies as of 01/31/2023   (No Known Allergies)    Family History  Problem Relation Age of Onset   Diabetes Father    Cancer - Colon Neg Hx    Liver disease Neg Hx     Social History   Socioeconomic History   Marital status: Divorced    Spouse name: Not on file   Number of children: Not on file   Years of education: Not on file   Highest education level: Not on file  Occupational History   Not on file  Tobacco Use   Smoking status: Never    Passive exposure: Never   Smokeless tobacco: Never  Vaping Use   Vaping Use: Never used  Substance and Sexual Activity   Alcohol use: No   Drug use: No   Sexual activity: Yes    Birth control/protection: Surgical, Injection    Comment: Depo injection  Other Topics Concern   Not on file  Social History Narrative   Not on file   Social Determinants of Health   Financial Resource Strain: Medium Risk (04/22/2020)   Overall Financial Resource Strain (CARDIA)    Difficulty of Paying Living Expenses: Somewhat hard  Food Insecurity: No Food Insecurity (04/22/2020)   Hunger Vital Sign    Worried About Running Out of Food in the Last Year: Never true    Ran Out of Food in the Last Year: Never true  Transportation Needs: No Transportation Needs (04/22/2020)   PRAPARE - Hydrologist (Medical): No    Lack of Transportation (Non-Medical): No  Physical Activity: Insufficiently Active (04/22/2020)   Exercise Vital Sign    Days of Exercise per Week: 1 day    Minutes of  Exercise per Session: 30 min  Stress: No Stress Concern Present (04/22/2020)   [place]    Feeling of Stress : Not at all  Social Connections: Moderately Integrated (04/22/2020)   Social Connection and Isolation Panel [NHANES]    Frequency of Communication with Friends and Family: Three times a week    Frequency of Social Gatherings with Friends and Family: Never    Attends Religious Services: More than 4 times per year    Active Member of [employer] or Organizations: No    Attends Music therapist: Never    Marital Status: Living with partner    Review of systems General: negative for malaise, night sweats, fever, chills, weight loss Neck: Negative for lumps, goiter, pain and significant neck swelling Resp: Negative for cough, wheezing, dyspnea at rest CV: Negative for chest pain, leg swelling, palpitations, orthopnea GI: denies melena, hematochezia, nausea, vomiting, diarrhea, constipation, dysphagia, odyonophagia, early satiety or  unintentional weight loss. +epigastric pain  MSK: Negative for joint pain or swelling, back pain, and muscle pain. Derm: Negative for itching or rash Psych: Denies depression, anxiety, memory loss, confusion. No homicidal or suicidal ideation.  Heme: Negative for prolonged bleeding, bruising easily, and swollen nodes. Endocrine: Negative for cold or heat intolerance, polyuria, polydipsia and goiter. Neuro: negative for tremor, gait imbalance, syncope and seizures. The remainder of the review of systems is noncontributory.  Physical Exam: BP 114/78 (BP Location: Left Arm, Patient Position: Sitting, Cuff Size: Large)   Pulse 91   Temp 99.5 F (37.5 C) (Oral)   Ht '5\' 2"'$  (1.575 m)   Wt 146 lb 1.6 oz (66.3 kg)   BMI 26.72 kg/m  General:   Alert and oriented. No distress noted. Pleasant and cooperative.  Head:  Normocephalic and atraumatic. Eyes:  Conjuctiva clear without scleral  icterus. Mouth:  Oral mucosa pink and moist. Good dentition. No lesions. Heart: Normal rate and rhythm, s1 and s2 heart sounds present.  Lungs: Clear lung sounds in all lobes. Respirations equal and unlabored. Abdomen:  +BS, soft, non-tender and non-distended. No rebound or guarding. No HSM or masses noted. Derm: No palmar erythema or jaundice Msk:  Symmetrical without gross deformities. Normal posture. Extremities:  Without edema. Neurologic:  Alert and  oriented x4 Psych:  Alert and cooperative. Normal mood and affect.  Invalid input(s): "6 MONTHS"   ASSESSMENT: Bonnie Fleming is a 52 y.o. female presenting today for follow up.  Recurrent choledocolithiasis: last ERCP in September 2023, she was previously maintained on Urso '600mg'$ , notably it appears she stopped this after her last ERCP due to being unaware she needed to continue on it. I discussed the importance of continuing this medication to help prevent further stone production. Will restart Urso with '600mg'$  in the am and '300mg'$  in the pm to total '900mg'$  per day. Will also update LFTs as last were in August.   GERD/epigastric pain: previously on protonix '40mg'$  with good results, patient also stopped this, having some epigastric pain on occasion, improved with eating. Previous H pylori, though breath testing in September showed eradication. No rectal bleeding, melena, early satiety, changes in appetite, nausea or vomiting. Will start back on daily PPI and should implement good reflux precautions to include avoiding greasy, spicy, fried, citrus foods, and be mindful that caffeine, carbonated drinks, chocolate and alcohol can increase reflux symptoms Stay upright 2-3 hours after eating, prior to lying down and avoid eating late in the evenings. She will make me aware if epigastric pain does not improve.    PLAN:  Restart protonix '40mg'$  daily 2. Restart Urso, take '600mg'$  in the am and '300mg'$  in the evening  3. Repeat LFTs  4. Pt to make  me aware of right sided upper abdominal pain, nausea, vomiting, itching or changes in your skin color 5. Reflux precautions  All questions were answered, patient verbalized understanding and is in agreement with plan as outlined above.   Follow Up: 6 months    Loral Campi L. Alver Sorrow, MSN, APRN, AGNP-C Adult-Gerontology Nurse Practitioner [facility]  I have reviewed the note and agree with the APP's assessment as described in this progress note  Maylon Peppers, MD Gastroenterology and Hepatology [facility]

## 2023-01-31 NOTE — Patient Instructions (Signed)
Please restart protonix '40mg'$  daily Please restart urso '300mg'$  capsules, you will take 2 in the morning and 1 in the evening Please make me aware of any right sided upper abdominal pain, nausea, vomiting, itching or changes in your skin color We will check labs to monitor your liver function today as well  Follow up 6 months or sooner if you have new or worsening symptoms

## 2023-01-31 NOTE — Telephone Encounter (Signed)
Patient was given the [place] estimate for visit today and still wanted to proceed with apt

## 2023-01-31 NOTE — Telephone Encounter (Signed)
Patients insurance is out of [place] - spoke to patient about this and she wanted to continue with check in - patient is aware that [facility] doesn't take her insurance

## 2023-02-01 LAB — HEPATIC FUNCTION PANEL
ALT: 35 IU/L — ABNORMAL HIGH (ref 0–32)
AST: 25 IU/L (ref 0–40)
Albumin: 4.8 g/dL (ref 3.9–4.9)
Alkaline Phosphatase: 242 IU/L — ABNORMAL HIGH (ref 44–121)
Bilirubin Total: 0.7 mg/dL (ref 0.0–1.2)
Bilirubin, Direct: 0.16 mg/dL (ref 0.00–0.40)
Total Protein: 7.6 g/dL (ref 6.0–8.5)

## 2023-02-07 ENCOUNTER — Other Ambulatory Visit (INDEPENDENT_AMBULATORY_CARE_PROVIDER_SITE_OTHER): Payer: Self-pay | Admitting: Gastroenterology

## 2023-02-07 DIAGNOSIS — R748 Abnormal levels of other serum enzymes: Secondary | ICD-10-CM

## 2023-02-11 ENCOUNTER — Other Ambulatory Visit (INDEPENDENT_AMBULATORY_CARE_PROVIDER_SITE_OTHER): Payer: Self-pay | Admitting: *Deleted

## 2023-02-11 DIAGNOSIS — K831 Obstruction of bile duct: Secondary | ICD-10-CM

## 2023-02-11 DIAGNOSIS — R748 Abnormal levels of other serum enzymes: Secondary | ICD-10-CM

## 2023-02-11 DIAGNOSIS — R7989 Other specified abnormal findings of blood chemistry: Secondary | ICD-10-CM

## 2023-02-11 DIAGNOSIS — K8050 Calculus of bile duct without cholangitis or cholecystitis without obstruction: Secondary | ICD-10-CM

## 2023-02-17 LAB — MITOCHONDRIAL ANTIBODIES: Mitochondrial Ab: <20 Units (ref 0.0–20.0)

## 2023-02-17 LAB — ALKALINE PHOSPHATASE: Alkaline Phosphatase: 240 IU/L — ABNORMAL HIGH (ref 44–121)

## 2023-02-17 LAB — IGM: IgM (Immunoglobulin M), Srm: 126 mg/dL (ref 26–217)

## 2023-04-16 ENCOUNTER — Encounter (INDEPENDENT_AMBULATORY_CARE_PROVIDER_SITE_OTHER): Payer: Self-pay | Admitting: Gastroenterology

## 2023-07-30 ENCOUNTER — Ambulatory Visit (INDEPENDENT_AMBULATORY_CARE_PROVIDER_SITE_OTHER): Payer: PRIVATE HEALTH INSURANCE | Admitting: Gastroenterology

## 2023-08-08 ENCOUNTER — Ambulatory Visit (INDEPENDENT_AMBULATORY_CARE_PROVIDER_SITE_OTHER): Payer: PRIVATE HEALTH INSURANCE | Admitting: Gastroenterology

## 2023-08-20 ENCOUNTER — Ambulatory Visit (INDEPENDENT_AMBULATORY_CARE_PROVIDER_SITE_OTHER): Payer: PRIVATE HEALTH INSURANCE | Admitting: Gastroenterology

## 2023-08-20 ENCOUNTER — Telehealth (INDEPENDENT_AMBULATORY_CARE_PROVIDER_SITE_OTHER): Payer: Self-pay | Admitting: Gastroenterology

## 2023-08-20 VITALS — BP 116/71 | HR 81 | Temp 98.8°F | Ht 62.0 in | Wt 150.1 lb

## 2023-08-20 DIAGNOSIS — Z1211 Encounter for screening for malignant neoplasm of colon: Secondary | ICD-10-CM | POA: Diagnosis not present

## 2023-08-20 DIAGNOSIS — R7989 Other specified abnormal findings of blood chemistry: Secondary | ICD-10-CM

## 2023-08-20 DIAGNOSIS — K8050 Calculus of bile duct without cholangitis or cholecystitis without obstruction: Secondary | ICD-10-CM | POA: Diagnosis not present

## 2023-08-20 DIAGNOSIS — K219 Gastro-esophageal reflux disease without esophagitis: Secondary | ICD-10-CM | POA: Diagnosis not present

## 2023-08-20 MED ORDER — PEG 3350-KCL-NA BICARB-NACL 420 G PO SOLR: 4000.0000 mL | Freq: Once | ORAL | 0 refills | Status: AC

## 2023-08-20 MED ORDER — URSODIOL 300 MG PO CAPS: 300.0000 mg | ORAL_CAPSULE | Freq: Two times a day (BID) | ORAL | 2 refills | Status: AC

## 2023-08-20 NOTE — Telephone Encounter (Signed)
Again, patient was given a [place] Estimate, has [employer] - wanted to proceed with appointment

## 2023-08-20 NOTE — Patient Instructions (Addendum)
-  I have ordered a blood test to check your liver function -Please continue with pantoprazole 40mg  daily -Be mindful of greasy, spicy, fried, citrus foods, caffeine (especially coffee), carbonated drinks, chocolate and alcohol as these can increase reflux symptoms Stay upright 2-3 hours after eating, prior to lying down and avoid eating late in the evenings. -If you are avoiding these foods/drinks and still having heartburn/acid coming up, please let me know as we may need to change your acid reflux medication -We will get you scheduled for a screening colonoscopy  -I am resending Urso for you to take for your history of gallstones, please take 300mg  capsule twice daily  Follow up 6 months  It was a pleasure to see you today. I want to create trusting relationships with patients and provide genuine, compassionate, and quality care. I truly value your feedback! please be on the lookout for a survey regarding your visit with me today. I appreciate your input about our visit and your time in completing this!    Antwan Pandya L. Jeanmarie Hubert, MSN, APRN, AGNP-C Adult-Gerontology Nurse Practitioner [facility] at [facility]

## 2023-08-20 NOTE — Progress Notes (Addendum)
Referring Provider: Tylene Fantasia., PA-C Primary Care Physician:  Tylene Fantasia., PA-C Primary GI Physician: Dr. Levon Hedger   Chief Complaint  Patient presents with   Gastroesophageal Reflux    Follow up on GERD. Takes protonix 40mg  daily. Has issues when drinking coffee with reflux.    choledocholithiasis    Follow up on choledocholithiasis. Was taking ursodiol but ran out of med about 2 months ago.    HPI:   Bonnie Fleming is a 52 y.o. female with past medical history of H pylori gastritis and choledocolithiasis.    Patient presenting today for follow up of GERD and choledocolithiasis  Last seen February 2024, at that time abdominal pain improved, some epigastric pain, not taking protonix. Not taking urso as she was not aware to continue this. No pruritus or jaundice.   Recommended to restart protonix 40mg  daily, restart urso 600mg  in the am, 300mg  pm, repeat LFTs  Labs in feb with Alk phos of 242, alt 35 AMA and IgM ordered thereafter were normal  Present:  Patient's daughter helps provide translation during the visit. Patient notes she is taking protonix 40mg  daily. She has some acid regurgitation at times, but feels only occurring when she drinks coffee. She denies dysphagia or odynophagia.   Denies abdominal pain, no nausea or vomiting. No jaundice or pruritus. She states she ran out the urso 2-3 months ago so she has not been taking this as she was not sure if she needed to remain on it  No rectal bleeding or melena.   She has not had any previous screening colonoscopy in the past.   ERCP : 08/2022 Choledocholithiasis - this was treated with stent removal and balloon  sweeps with stones removal  Last Colonoscopy: never   Last Endoscopy:05/16/22- Normal esophagus. - Normal stomach. - Normal examined duodenum. - Normal esophagus. - Z-line regular, 39 cm from the incisors. - Normal cardia, gastric fundus and gastric body. - Gastritis. Biopsied.(H pylori,  treated with pylera) - Normal duodenal bulb, second portion of the duodenum and major papilla.  No past medical history on file.  Past Surgical History:  Procedure Laterality Date   BILIARY STENT PLACEMENT N/A 05/16/2022   Procedure: BILIARY STENT PLACEMENT;  Surgeon: Malissa Hippo, MD;  Location: [facility];  Service: Gastroenterology;  Laterality: N/A;   CHOLECYSTECTOMY  2005   ERCP N/A 05/16/2022   Procedure: ENDOSCOPIC RETROGRADE CHOLANGIOPANCREATOGRAPHY (ERCP);  Surgeon: Malissa Hippo, MD;  Location: [facility];  Service: Gastroenterology;  Laterality: N/A;   ESOPHAGOGASTRODUODENOSCOPY N/A 05/16/2022   Procedure: ESOPHAGOGASTRODUODENOSCOPY (EGD);  Surgeon: Malissa Hippo, MD;  Location: [facility];  Service: Gastroenterology;  Laterality: N/A;   LAPAROSCOPIC UNILATERAL SALPINGO OOPHERECTOMY Right 05/18/2020   Procedure: LAPAROSCOPIC RIGHT SALPINGO OOPHORECTOMY;  Surgeon: Lazaro Arms, MD;  Location: [facility];  Service: Gynecology;  Laterality: Right;   LITHOTRIPSY N/A 05/16/2022   Procedure: MECHANICAL LITHOTRIPSY;  Surgeon: Malissa Hippo, MD;  Location: [facility];  Service: Gastroenterology;  Laterality: N/A;   REMOVAL OF STONES N/A 05/16/2022   Procedure: REMOVAL OF STONES;  Surgeon: Malissa Hippo, MD;  Location: [facility];  Service: Gastroenterology;  Laterality: N/A;   SPHINCTEROTOMY N/A 05/16/2022   Procedure: SPHINCTEROTOMY DILATED TO ;  Surgeon: Malissa Hippo, MD;  Location: [facility];  Service: Gastroenterology;  Laterality: N/A;    Current Outpatient Medications  Medication Sig Dispense Refill   pantoprazole (PROTONIX) 40 MG tablet Take 1 tablet (40 mg total) by mouth daily. 90 tablet 3  ursodiol (ACTIGALL) 300 MG capsule Take 1 capsule (300 mg total) by mouth 2 (two) times daily. (Patient not taking: Reported on 01/31/2023) 180 capsule 1   No current facility-administered medications for this visit.    Allergies as of 08/20/2023   (No Known Allergies)    Family History   Problem Relation Age of Onset   Diabetes Father    Cancer - Colon Neg Hx    Liver disease Neg Hx     Social History   Socioeconomic History   Marital status: Divorced    Spouse name: Not on file   Number of children: Not on file   Years of education: Not on file   Highest education level: Not on file  Occupational History   Not on file  Tobacco Use   Smoking status: Never    Passive exposure: Never   Smokeless tobacco: Never  Vaping Use   Vaping status: Never Used  Substance and Sexual Activity   Alcohol use: No   Drug use: No   Sexual activity: Yes    Birth control/protection: Surgical, Injection    Comment: Depo injection  Other Topics Concern   Not on file  Social History Narrative   Not on file   Social Determinants of Health   F[place]ming Resource Strain: Medium Risk (04/22/2020)   Overall Fleming Resource Strain (CARDIA)    Difficulty of Paying Living Expenses: Somewhat hard  Food Insecurity: No Food Insecurity (04/22/2020)   Hunger Vital Sign    Worried About Running Out of Food in the Last Year: Never true    Ran Out of Food in the Last Year: Never true  Transportation Needs: No Transportation Needs (04/22/2020)   PRAPARE - Administrator, Civil Service (Medical): No    Lack of Transportation (Non-Medical): No  Physical Activity: Insufficiently Active (04/22/2020)   Exercise Vital Sign    Days of Exercise per Week: 1 day    Minutes of Exercise per Session: 30 min  Stress: No Stress Concern Present (04/22/2020)   Bonnie Fleming    Feeling of Stress : Not at all  Social Connections: Moderately Integrated (04/22/2020)   Social Connection and Isolation Panel [Bonnie Fleming]    Frequency of Communication with Friends and Family: Three times a week    Frequency of Social Gatherings with Friends and Family: Never    Attends Religious Services: More than 4 times per year    Active Member of Bonnie Fleming  or Organizations: No    Attends Engineer, structural: Never    Marital Status: Living with partner    Review of systems General: negative for malaise, night sweats, fever, chills, weight loss Neck: Negative for lumps, goiter, pain and significant neck swelling Resp: Negative for cough, wheezing, dyspnea at rest CV: Negative for chest pain, leg swelling, palpitations, orthopnea GI: denies melena, hematochezia, nausea, vomiting, diarrhea, constipation, dysphagia, odyonophagia, early satiety or unintentional weight loss. +acid regurgitation  MSK: Negative for joint pain or swelling, back pain, and muscle pain. Derm: Negative for itching or rash Psych: Denies depression, anxiety, memory loss, confusion. No homicidal or suicidal ideation.  Heme: Negative for prolonged bleeding, bruising easily, and swollen nodes. Endocrine: Negative for cold or heat intolerance, polyuria, polydipsia and goiter. Neuro: negative for tremor, gait imbalance, syncope and seizures. The remainder of the review of systems is noncontributory.  Physical Exam: BP 116/71 (BP Location: Right Arm, Patient Position: Sitting, Cuff Size: Normal)   Pulse  81   Temp 98.8 F (37.1 C) (Oral)   Ht 5\' 2"  (1.575 m)   Wt 150 lb 1.6 oz (68.1 kg)   BMI 27.45 kg/m  General:   Alert and oriented. No distress noted. Pleasant and cooperative.  Head:  Normocephalic and atraumatic. Eyes:  Conjuctiva clear without scleral icterus. Mouth:  Oral mucosa pink and moist. Good dentition. No lesions. Heart: Normal rate and rhythm, s1 and s2 heart sounds present.  Lungs: Clear lung sounds in all lobes. Respirations equal and unlabored. Abdomen:  +BS, soft, non-tender and non-distended. No rebound or guarding. No HSM or masses noted. Derm: No palmar erythema or jaundice Msk:  Symmetrical without gross deformities. Normal posture. Extremities:  Without edema. Neurologic:  Alert and  oriented x4 Psych:  Alert and cooperative. Normal  mood and affect.  Invalid input(s): "6 MONTHS"   ASSESSMENT: Bonnie Fleming is a 52 y.o. female presenting today for follow up of GERD and choledocolithiasis.  GERD:on protonix 40mg  daily, no nausea, vomiting, dysphagia, odyonophagia, notes acid regurgitation but only when drinking coffee sometimes. Recommend continue with PPI daily, good reflux precautions to include being mindful of greasy, spicy, fried, citrus foods, and be mindful that caffeine/coffee, carbonated drinks, chocolate and alcohol can increase reflux symptoms, Stay upright 2-3 hours after eating, prior to lying down and avoid eating late in the evenings. Consider change in PPI therapy if symptoms persist or worsen despite dietary changes   History of choledocolithiasis: last ERCP September 2023, previously maintained on Urso 600mg , which she has had compliance issues with remaining on consistently as she initially did not know she was to remain on this and most recently ran out.  I discussed the importance of continuing this medication to help prevent further stone production. Will restart Urso at 300mg  BID.   Notably her alkaline phosphatase has been elevated atleast since 2021, peak 489 in may 2023, at that time she underwent ERCP and had improvement in AP though she has continued with mild elevation since in the 200 range, last was 240 in February, IgM and AMA were checked at that time which were both negative. Acute hep panel in may 2023 was negative. She has not had recent dedicated liver imaging. Will repeat LFTs today, if AP remains elevated, will proceed with US of the liver.   Encounter for screening colonoscopy: no previous CRC screenings, I discussed the importance of screening for CRC starting at age 49 with the patient and her daughter. Indications, risks and benefits of procedure discussed in detail with patient. Patient verbalized understanding and is in agreement to proceed with colonoscopy.   PLAN:  Continue  protonix 40mg  daily  2. Restart Urso 300mg  BID  3. CMP 4. Reflux precautions, consider change in PPI therapy if acid regurgitation persists  5. Consider liver US if LFTs remain elevated  6. Schedule screening colonoscopy-ASA II   All questions were answered, patient verbalized understanding and is in agreement with plan as outlined above.   Follow Up: 6 months   Falon Huesca L. Jeanmarie Hubert, MSN, APRN, AGNP-C Adult-Gerontology Nurse Practitioner [facility]  I have reviewed the note and agree with the APP's assessment as described in this progress note  Katrinka Blazing, MD Gastroenterology and Hepatology [facility]

## 2023-08-21 LAB — COMPREHENSIVE METABOLIC PANEL
ALT: 33 IU/L — ABNORMAL HIGH (ref 0–32)
AST: 27 IU/L (ref 0–40)
Albumin: 4.4 g/dL (ref 3.9–4.9)
Alkaline Phosphatase: 179 IU/L — ABNORMAL HIGH (ref 44–121)
BUN/Creatinine Ratio: 17 (ref 9–23)
BUN: 9 mg/dL (ref 6–24)
Bilirubin Total: 0.6 mg/dL (ref 0.0–1.2)
CO2: 22 mmol/L (ref 20–29)
Calcium: 9.5 mg/dL (ref 8.7–10.2)
Chloride: 104 mmol/L (ref 96–106)
Creatinine, Ser: 0.52 mg/dL — ABNORMAL LOW (ref 0.57–1.00)
Globulin, Total: 2.7 g/dL (ref 1.5–4.5)
Glucose: 91 mg/dL (ref 70–99)
Potassium: 4.2 mmol/L (ref 3.5–5.2)
Sodium: 140 mmol/L (ref 134–144)
Total Protein: 7.1 g/dL (ref 6.0–8.5)
eGFR: 113 mL/min/{1.73_m2} (ref >59)

## 2023-08-23 ENCOUNTER — Other Ambulatory Visit (INDEPENDENT_AMBULATORY_CARE_PROVIDER_SITE_OTHER): Payer: Self-pay | Admitting: Gastroenterology

## 2023-08-23 ENCOUNTER — Encounter (INDEPENDENT_AMBULATORY_CARE_PROVIDER_SITE_OTHER): Payer: Self-pay

## 2023-08-23 DIAGNOSIS — R7989 Other specified abnormal findings of blood chemistry: Secondary | ICD-10-CM

## 2023-08-30 ENCOUNTER — Telehealth (INDEPENDENT_AMBULATORY_CARE_PROVIDER_SITE_OTHER): Payer: Self-pay | Admitting: Gastroenterology

## 2023-08-30 NOTE — Telephone Encounter (Signed)
Emails from [facility] stating that PA needed for TCS. Advised pre service center that her insurance is OON and she would be self pay, they advised I could still attempt a PA. PA filled out and faxed to [phone]

## 2023-09-04 ENCOUNTER — Telehealth (INDEPENDENT_AMBULATORY_CARE_PROVIDER_SITE_OTHER): Payer: Self-pay | Admitting: Gastroenterology

## 2023-09-04 NOTE — Telephone Encounter (Signed)
Thanks for the update

## 2023-09-04 NOTE — Telephone Encounter (Signed)
Pt left voicemail needing to postpone TCS. Pt had TCS appt for tomorrow. Pt states once she gets new insurance she will call back to get rescheduled.

## 2023-09-05 ENCOUNTER — Encounter (HOSPITAL_COMMUNITY): Admission: RE | Payer: Self-pay | Source: Ambulatory Visit

## 2023-09-05 ENCOUNTER — Ambulatory Visit (HOSPITAL_COMMUNITY)
Admission: RE | Admit: 2023-09-05 | Payer: PRIVATE HEALTH INSURANCE | Source: Ambulatory Visit | Admitting: Gastroenterology

## 2023-09-05 SURGERY — COLONOSCOPY WITH PROPOFOL
Anesthesia: Monitor Anesthesia Care

## 2023-09-06 ENCOUNTER — Ambulatory Visit (HOSPITAL_COMMUNITY): Admission: RE | Admit: 2023-09-06 | Payer: PRIVATE HEALTH INSURANCE | Source: Ambulatory Visit

## 2023-10-07 ENCOUNTER — Ambulatory Visit
Admission: EM | Admit: 2023-10-07 | Discharge: 2023-10-07 | Disposition: A | Discharge status: Home / Self Care | Payer: No Typology Code available for payment source | Attending: Nurse Practitioner | Admitting: Nurse Practitioner

## 2023-10-07 DIAGNOSIS — L03113 Cellulitis of right upper limb: Secondary | ICD-10-CM

## 2023-10-07 MED ORDER — CHLORHEXIDINE GLUCONATE 4 % EX SOLN: Freq: Every day | CUTANEOUS | 0 refills | Status: DC | PRN

## 2023-10-07 MED ORDER — MUPIROCIN 2 % EX OINT: 1.0000 | TOPICAL_OINTMENT | Freq: Two times a day (BID) | CUTANEOUS | 0 refills | Status: DC

## 2023-10-07 NOTE — Discharge Instructions (Signed)
Use medication as prescribed. Stop use of hydrogen peroxide immediately. May take over-the-counter Tylenol or ibuprofen as needed for pain, fever, or general discomfort. Apply cool compresses to the affected area to help with pain or swelling. Cleanse the area twice daily and apply medication twice daily as prescribed.  When you are out in public, keep the area covered.  You may leave the area open to air when you are at home. Follow-up with your dermatologist as scheduled.  If you experience worsening symptoms such as increasing redness, foul-smelling drainage, fever, chills, or other concerns, please go to the emergency department immediately for further evaluation. Follow-up as needed.

## 2023-10-07 NOTE — ED Triage Notes (Signed)
Pt states she recently had a bump on her right arm removed on 09/26/23, and pt now thinks it is getting infected. Pt is having redness, pain and yellow puss at her sutures that started Friday. Pt has a appointment on Friday to have sutures removed.

## 2023-10-07 NOTE — ED Provider Notes (Signed)
[facility]    CSN: [id number] Arrival date & time: 10/07/23  1211      History   Chief Complaint Chief Complaint  Patient presents with   Wound Infection    HPI Bonnie Fleming is a 52 y.o. female.   The history is provided by the patient. Language interpreter usedLeroy Kennedy; #[id number].   Patient presents for a possible wound infection to the right upper arm after she had a procedure done by her dermatologist on 09/26/2023.  Patient states that there was a "knot" on the arm that was removed.  She states now she is having increased redness, pain, and yellow drainage from the site.  She is scheduled to see her dermatologist on 10/11/2023.  Patient denies fever, chills, chest pain, abdominal pain, nausea, vomiting, diarrhea, or foul-smelling drainage from the site.  She reports she has been using water and hydrogen peroxide to the area.  History reviewed. No pertinent past medical history.  Patient Active Problem List   Diagnosis Date Noted   Encounter for screening colonoscopy 08/20/2023   Choledocholithiasis 06/12/2022   Gastritis, Helicobacter pylori 06/12/2022   Gastroesophageal reflux disease 05/15/2022   Elevated LFTs 05/15/2022   Epigastric pain    Dilated cbd, acquired    Intrahepatic bile duct dilation    Obstructive jaundice 05/14/2022    Past Surgical History:  Procedure Laterality Date   BILIARY STENT PLACEMENT N/A 05/16/2022   Procedure: BILIARY STENT PLACEMENT;  Surgeon: Malissa Hippo, MD;  Location: [facility];  Service: Gastroenterology;  Laterality: N/A;   CHOLECYSTECTOMY  2005   ERCP N/A 05/16/2022   Procedure: ENDOSCOPIC RETROGRADE CHOLANGIOPANCREATOGRAPHY (ERCP);  Surgeon: Malissa Hippo, MD;  Location: [facility];  Service: Gastroenterology;  Laterality: N/A;   ESOPHAGOGASTRODUODENOSCOPY N/A 05/16/2022   Procedure: ESOPHAGOGASTRODUODENOSCOPY (EGD);  Surgeon: Malissa Hippo, MD;  Location: [facility];  Service: Gastroenterology;  Laterality: N/A;    LAPAROSCOPIC UNILATERAL SALPINGO OOPHERECTOMY Right 05/18/2020   Procedure: LAPAROSCOPIC RIGHT SALPINGO OOPHORECTOMY;  Surgeon: Lazaro Arms, MD;  Location: [facility];  Service: Gynecology;  Laterality: Right;   LITHOTRIPSY N/A 05/16/2022   Procedure: MECHANICAL LITHOTRIPSY;  Surgeon: Malissa Hippo, MD;  Location: [facility];  Service: Gastroenterology;  Laterality: N/A;   REMOVAL OF STONES N/A 05/16/2022   Procedure: REMOVAL OF STONES;  Surgeon: Malissa Hippo, MD;  Location: [facility];  Service: Gastroenterology;  Laterality: N/A;   SPHINCTEROTOMY N/A 05/16/2022   Procedure: SPHINCTEROTOMY DILATED TO ;  Surgeon: Malissa Hippo, MD;  Location: [facility];  Service: Gastroenterology;  Laterality: N/A;    OB History   No obstetric history on file.      Home Medications    Prior to Admission medications   Medication Sig Start Date End Date Taking? Authorizing Provider  chlorhexidine (HIBICLENS) 4 % external liquid Apply topically daily as needed. Apply 3 to 4 drops of the solution into warm water, cleanse the area with the solution and water twice daily until symptoms improve. 10/07/23  Yes Kelyse Pask-Warren, Sadie Haber, NP  mupirocin ointment (BACTROBAN) 2 % Apply 1 Application topically 2 (two) times daily. 10/07/23  Yes Timarion Agcaoili-Warren, Sadie Haber, NP  pantoprazole (PROTONIX) 40 MG tablet Take 1 tablet (40 mg total) by mouth daily. 01/31/23  Yes Carlan, Chelsea L, NP  ursodiol (ACTIGALL) 300 MG capsule Take 1 capsule (300 mg total) by mouth 2 (two) times daily. 08/20/23   Raquel James, NP    Family History Family History  Problem Relation Age of Onset  Diabetes Father    Cancer - Colon Neg Hx    Liver disease Neg Hx     Social History Social History   Tobacco Use   Smoking status: Never    Passive exposure: Never   Smokeless tobacco: Never  Vaping Use   Vaping status: Never Used  Substance Use Topics   Alcohol use: No   Drug use: No     Allergies   Patient has no known  allergies.   Review of Systems Review of Systems Per HPI  Physical Exam Triage Vital Signs ED Triage Vitals  Encounter Vitals Group     BP 10/07/23 1418 131/82     Systolic BP Percentile --      Diastolic BP Percentile --      Pulse Rate 10/07/23 1418 77     Resp 10/07/23 1418 16     Temp 10/07/23 1418 99 F (37.2 C)     Temp Source 10/07/23 1418 Oral     SpO2 10/07/23 1418 98 %     Weight --      Height --      Head Circumference --      Peak Flow --      Pain Score 10/07/23 1425 7     Pain Loc --      Pain Education --      Exclude from Growth Chart --    No data found.  Updated Vital Signs BP 131/82 (BP Location: Right Arm)   Pulse 77   Temp 99 F (37.2 C) (Oral)   Resp 16   LMP  (LMP Unknown)   SpO2 98%   Visual Acuity Right Eye Distance:   Left Eye Distance:   Bilateral Distance:    Right Eye Near:   Left Eye Near:    Bilateral Near:     Physical Exam Vitals and nursing note reviewed.  Constitutional:      General: She is not in acute distress.    Appearance: Normal appearance.  HENT:     Head: Normocephalic.  Eyes:     Extraocular Movements: Extraocular movements intact.     Pupils: Pupils are equal, round, and reactive to light.  Cardiovascular:     Rate and Rhythm: Normal rate and regular rhythm.     Pulses: Normal pulses.     Heart sounds: Normal heart sounds.  Pulmonary:     Effort: Pulmonary effort is normal. No respiratory distress.     Breath sounds: Normal breath sounds. No stridor. No wheezing, rhonchi or rales.  Abdominal:     General: Bowel sounds are normal.     Palpations: Abdomen is soft.  Musculoskeletal:     Cervical back: Normal range of motion.  Lymphadenopathy:     Cervical: No cervical adenopathy.  Skin:    General: Skin is warm and dry.     Comments: Surgical wound noted to the lateral aspect of the right upper extremity.  Wound with approximately 10 sutures in place.  Localized erythema present around the wound.   Erythema does not extend up the arm or down the arm.  There is no swelling, oozing, drainage or fluctuance present.  Neurological:     General: No focal deficit present.     Mental Status: She is alert and oriented to person, place, and time.  Psychiatric:        Mood and Affect: Mood normal.        Behavior: Behavior normal.  UC Treatments / Results  Labs (all labs ordered are listed, but only abnormal results are displayed) Labs Reviewed - No data to display  EKG   Radiology No results found.  Procedures Procedures (including critical care time)  Medications Ordered in UC Medications - No data to display  Initial Impression / Assessment and Plan / UC Course  I have reviewed the triage vital signs and the nursing notes.  Pertinent labs & imaging results that were available during my care of the patient were reviewed by me and considered in my medical decision making (see chart for details).  The patient is well-appearing, she is in no acute distress, vital signs are stable.  Patient with surgical wound with remaining sutures in place.  Patient had dermatology procedure on 9/26, and is concerned for infection.  She has been using hydrogen peroxide to the affected area.  Do not suspect systemic involvement at this time, will treat patient for localized cellulitis with mupirocin 2% ointment, and Hibiclens 4% external liquid.  Supportive care recommendations were provided and discussed with the patient to include over-the-counter analgesics, discontinuing use of hydrogen peroxide, and cool compresses to the affected area as needed to help with pain or swelling.  Patient was given strict indications of when to follow-up in the emergency department.  Patient will otherwise follow-up with her dermatology as scheduled for 10/11/2023.  Patient is in agreement with this plan of care and verbalizes understanding.  All questions were answered.  Patient stable for discharge.  Final  Clinical Impressions(s) / UC Diagnoses   Final diagnoses:  Cellulitis of right upper extremity     Discharge Instructions      Use medication as prescribed. Stop use of hydrogen peroxide immediately. May take over-the-counter Tylenol or ibuprofen as needed for pain, fever, or general discomfort. Apply cool compresses to the affected area to help with pain or swelling. Cleanse the area twice daily and apply medication twice daily as prescribed.  When you are out in public, keep the area covered.  You may leave the area open to air when you are at home. Follow-up with your dermatologist as scheduled.  If you experience worsening symptoms such as increasing redness, foul-smelling drainage, fever, chills, or other concerns, please go to the emergency department immediately for further evaluation. Follow-up as needed.     ED Prescriptions     Medication Sig Dispense Auth. Provider   chlorhexidine (HIBICLENS) 4 % external liquid Apply topically daily as needed. Apply 3 to 4 drops of the solution into warm water, cleanse the area with the solution and water twice daily until symptoms improve. 118 mL Brantlee Penn-Warren, Sadie Haber, NP   mupirocin ointment (BACTROBAN) 2 % Apply 1 Application topically 2 (two) times daily. 30 g Jenae Tomasello-Warren, Sadie Haber, NP      PDMP not reviewed this encounter.   Abran Cantor, NP 10/07/23 1643

## 2024-01-07 ENCOUNTER — Ambulatory Visit
Admission: EM | Admit: 2024-01-07 | Discharge: 2024-01-07 | Disposition: A | Discharge status: Home / Self Care | Payer: No Typology Code available for payment source | Attending: Family Medicine | Admitting: Family Medicine

## 2024-01-07 DIAGNOSIS — M545 Low back pain, unspecified: Secondary | ICD-10-CM

## 2024-01-07 DIAGNOSIS — W19XXXA Unspecified fall, initial encounter: Secondary | ICD-10-CM

## 2024-01-07 DIAGNOSIS — G44319 Acute post-traumatic headache, not intractable: Secondary | ICD-10-CM

## 2024-01-07 DIAGNOSIS — S161XXA Strain of muscle, fascia and tendon at neck level, initial encounter: Secondary | ICD-10-CM

## 2024-01-07 MED ORDER — NAPROXEN 500 MG PO TABS: 500.0000 mg | ORAL_TABLET | Freq: Two times a day (BID) | ORAL | 0 refills | Status: DC | PRN

## 2024-01-07 MED ORDER — TIZANIDINE HCL 4 MG PO CAPS: 4.0000 mg | ORAL_CAPSULE | Freq: Three times a day (TID) | ORAL | 0 refills | Status: DC | PRN

## 2024-01-07 NOTE — Discharge Instructions (Signed)
 If you experience any worsening symptoms to include nausea, vomiting, worst headache of life, confusion or other worsening symptoms go to the emergency department.  I have sent a muscle relaxer and anti-inflammatory pain medication and you may use heat, massage, rest.  Follow-up for unresolving symptoms.

## 2024-01-07 NOTE — ED Provider Notes (Signed)
 [facility]    CSN: [id number] Arrival date & time: 01/07/24  1551      History   Chief Complaint No chief complaint on file.   HPI Bonnie Fleming is a 53 y.o. female.   Patient declines medical interpreter today and wishes her daughter who is with her today to translate for us .  Patient presenting today with headache, bilateral neck and shoulder soreness, low back pain after slipping and falling on the ice this morning.  States that she fell onto her bottom and then the back of her head hit the concrete.  She denies loss of consciousness, nausea, vomiting, significant dizziness, visual change, decreased range of motion, bowel or bladder incontinence, saddle anesthesia.  She has been feeling a bit fatigued and states her head does hurt a bit more with movement.  So far trying Tylenol  with minimal relief.    History reviewed. No pertinent past medical history.  Patient Active Problem List   Diagnosis Date Noted   Encounter for screening colonoscopy 08/20/2023   Choledocholithiasis 06/12/2022   Gastritis, Helicobacter pylori 06/12/2022   Gastroesophageal reflux disease 05/15/2022   Elevated LFTs 05/15/2022   Epigastric pain    Dilated cbd, acquired    Intrahepatic bile duct dilation    Obstructive jaundice 05/14/2022    Past Surgical History:  Procedure Laterality Date   BILIARY STENT PLACEMENT N/A 05/16/2022   Procedure: BILIARY STENT PLACEMENT;  Surgeon: Golda Claudis PENNER, MD;  Location: [facility];  Service: Gastroenterology;  Laterality: N/A;   CHOLECYSTECTOMY  2005   ERCP N/A 05/16/2022   Procedure: ENDOSCOPIC RETROGRADE CHOLANGIOPANCREATOGRAPHY (ERCP);  Surgeon: Golda Claudis PENNER, MD;  Location: [facility];  Service: Gastroenterology;  Laterality: N/A;   ESOPHAGOGASTRODUODENOSCOPY N/A 05/16/2022   Procedure: ESOPHAGOGASTRODUODENOSCOPY (EGD);  Surgeon: Golda Claudis PENNER, MD;  Location: [facility];  Service: Gastroenterology;  Laterality: N/A;   LAPAROSCOPIC  UNILATERAL SALPINGO OOPHERECTOMY Right 05/18/2020   Procedure: LAPAROSCOPIC RIGHT SALPINGO OOPHORECTOMY;  Surgeon: Jayne Vonn DEL, MD;  Location: [facility];  Service: Gynecology;  Laterality: Right;   LITHOTRIPSY N/A 05/16/2022   Procedure: MECHANICAL LITHOTRIPSY;  Surgeon: Golda Claudis PENNER, MD;  Location: [facility];  Service: Gastroenterology;  Laterality: N/A;   REMOVAL OF STONES N/A 05/16/2022   Procedure: REMOVAL OF STONES;  Surgeon: Golda Claudis PENNER, MD;  Location: [facility];  Service: Gastroenterology;  Laterality: N/A;   SPHINCTEROTOMY N/A 05/16/2022   Procedure: SPHINCTEROTOMY DILATED TO ;  Surgeon: Golda Claudis PENNER, MD;  Location: [facility];  Service: Gastroenterology;  Laterality: N/A;    OB History   No obstetric history on file.      Home Medications    Prior to Admission medications   Medication Sig Start Date End Date Taking? Authorizing Provider  naproxen  (NAPROSYN ) 500 MG tablet Take 1 tablet (500 mg total) by mouth 2 (two) times daily as needed. 01/07/24  Yes Stuart Vernell Norris, PA-C  tiZANidine  (ZANAFLEX ) 4 MG capsule Take 1 capsule (4 mg total) by mouth 3 (three) times daily as needed for muscle spasms. Do not drink alcohol or drive while taking this medication 01/07/24  Yes Stuart Vernell Norris, PA-C  chlorhexidine  (HIBICLENS ) 4 % external liquid Apply topically daily as needed. Apply 3 to 4 drops of the solution into warm water , cleanse the area with the solution and water  twice daily until symptoms improve. 10/07/23   Leath-Warren, Etta PARAS, NP  mupirocin  ointment (BACTROBAN ) 2 % Apply 1 Application topically 2 (two) times daily. 10/07/23   Leath-Warren, Etta  J, NP  pantoprazole  (PROTONIX ) 40 MG tablet Take 1 tablet (40 mg total) by mouth daily. 01/31/23   Carlan, Chelsea L, NP  ursodiol  (ACTIGALL ) 300 MG capsule Take 1 capsule (300 mg total) by mouth 2 (two) times daily. 08/20/23   Mariette [name] CROME, NP    Family History Family History  Problem Relation Age of Onset    Diabetes Father    Cancer - Colon Neg Hx    Liver disease Neg Hx     Social History Social History   Tobacco Use   Smoking status: Never    Passive exposure: Never   Smokeless tobacco: Never  Vaping Use   Vaping status: Never Used  Substance Use Topics   Alcohol use: No   Drug use: No     Allergies   Patient has no known allergies.   Review of Systems Review of Systems PER HPI  Physical Exam Triage Vital Signs ED Triage Vitals  Encounter Vitals Group     BP 01/07/24 1616 131/71     Systolic BP Percentile --      Diastolic BP Percentile --      Pulse Rate 01/07/24 1616 89     Resp 01/07/24 1616 15     Temp 01/07/24 1616 98.3 F (36.8 C)     Temp Source 01/07/24 1616 Oral     SpO2 01/07/24 1616 98 %     Weight --      Height --      Head Circumference --      Peak Flow --      Pain Score 01/07/24 1619 8     Pain Loc --      Pain Education --      Exclude from Growth Chart --    No data found.  Updated Vital Signs BP 131/71 (BP Location: Right Arm)   Pulse 89   Temp 98.3 F (36.8 C) (Oral)   Resp 15   SpO2 98%   Visual Acuity Right Eye Distance:   Left Eye Distance:   Bilateral Distance:    Right Eye Near:   Left Eye Near:    Bilateral Near:     Physical Exam Vitals and nursing note reviewed.  Constitutional:      Appearance: Normal appearance. She is not ill-appearing.  HENT:     Head: Atraumatic.     Mouth/Throat:     Mouth: Mucous membranes are moist.  Eyes:     Extraocular Movements: Extraocular movements intact.     Conjunctiva/sclera: Conjunctivae normal.  Cardiovascular:     Rate and Rhythm: Normal rate and regular rhythm.     Heart sounds: Normal heart sounds.  Pulmonary:     Effort: Pulmonary effort is normal.     Breath sounds: Normal breath sounds.  Musculoskeletal:        General: Tenderness and signs of injury present. Normal range of motion.     Cervical back: Normal range of motion and neck supple.     Comments:  Small contusion to posterior scalp, no bony deformities palpable.  Tender to palpation to bilateral upper back and cervical paraspinal region, lumbosacral region.  Negative straight leg raise bilateral lower extremities  Skin:    General: Skin is warm and dry.     Findings: No bruising.  Neurological:     Mental Status: She is alert and oriented to person, place, and time.     Cranial Nerves: No cranial nerve deficit.  Motor: No weakness.     Gait: Gait normal.     Comments: All 4 extremities neurovascularly intact  Psychiatric:        Mood and Affect: Mood normal.        Thought Content: Thought content normal.        Judgment: Judgment normal.      UC Treatments / Results  Labs (all labs ordered are listed, but only abnormal results are displayed) Labs Reviewed - No data to display  EKG   Radiology No results found.  Procedures Procedures (including critical care time)  Medications Ordered in UC Medications - No data to display  Initial Impression / Assessment and Plan / UC Course  I have reviewed the triage vital signs and the nursing notes.  Pertinent labs & imaging results that were available during my care of the patient were reviewed by me and considered in my medical decision making (see chart for details).     Suspect muscular pain secondary to fall.  No neurologic red flags on exam or history today but discussed at length to go to the emergency department for any worsening symptoms to include nausea, vomiting, significant dizziness, visual changes, no status changes.  Discussed that we are unable to diagnose a concussion here but to rest, reduce stimulation from screens or other distractors and to follow-up for worsening symptoms.  Naproxen  and Zanaflex  sent for pain and muscular tightness relief. Final Clinical Impressions(s) / UC Diagnoses   Final diagnoses:  Acute post-traumatic headache, not intractable  Strain of neck muscle, initial encounter  Acute  midline low back pain, unspecified whether sciatica present  Fall, initial encounter     Discharge Instructions      If you experience any worsening symptoms to include nausea, vomiting, worst headache of life, confusion or other worsening symptoms go to the emergency department.  I have sent a muscle relaxer and anti-inflammatory pain medication and you may use heat, massage, rest.  Follow-up for unresolving symptoms.     ED Prescriptions     Medication Sig Dispense Auth. Provider   tiZANidine  (ZANAFLEX ) 4 MG capsule Take 1 capsule (4 mg total) by mouth 3 (three) times daily as needed for muscle spasms. Do not drink alcohol or drive while taking this medication 15 capsule Stuart Vernell Norris, PA-C   naproxen  (NAPROSYN ) 500 MG tablet Take 1 tablet (500 mg total) by mouth 2 (two) times daily as needed. 20 tablet Stuart Vernell Norris, [place]      PDMP not reviewed this encounter.   Stuart Vernell Norris, [place] 01/07/24 1927

## 2024-01-07 NOTE — ED Triage Notes (Signed)
 Per daughter pt stepped on ice on the way into work hitting her head, feeling dizziness, tiredness, and neck pain. Denies loss oc consciousness

## 2024-01-12 ENCOUNTER — Emergency Department (HOSPITAL_COMMUNITY)
Admission: EM | Admit: 2024-01-12 | Discharge: 2024-01-12 | Disposition: A | Discharge status: Home / Self Care | Payer: No Typology Code available for payment source | Attending: Emergency Medicine | Admitting: Emergency Medicine

## 2024-01-12 ENCOUNTER — Emergency Department (HOSPITAL_COMMUNITY): Payer: No Typology Code available for payment source

## 2024-01-12 ENCOUNTER — Other Ambulatory Visit: Payer: Self-pay

## 2024-01-12 DIAGNOSIS — W19XXXA Unspecified fall, initial encounter: Secondary | ICD-10-CM | POA: Diagnosis not present

## 2024-01-12 DIAGNOSIS — R519 Headache, unspecified: Secondary | ICD-10-CM | POA: Insufficient documentation

## 2024-01-12 DIAGNOSIS — M546 Pain in thoracic spine: Secondary | ICD-10-CM | POA: Insufficient documentation

## 2024-01-12 MED ORDER — HYDROCODONE-ACETAMINOPHEN 5-325 MG PO TABS
1.0000 | ORAL_TABLET | Freq: Once | ORAL | Status: AC
  Administered 2024-01-12: 1 via ORAL
  Filled 2024-01-12: qty 1

## 2024-01-12 MED ORDER — HYDROCODONE-ACETAMINOPHEN 5-325 MG PO TABS: ORAL_TABLET | ORAL | 0 refills | Status: DC

## 2024-01-12 NOTE — Discharge Instructions (Signed)
Follow-up with your family doctor if not improving 

## 2024-01-12 NOTE — ED Triage Notes (Signed)
 Pt c/o posterior headache starting Tuesday after falling on ice that has not gone away. Pt also states left shoulder blade pain that radiates down her back. Pt was scene at [facility] and prescribed naproxen and tizanidine with no relief.

## 2024-01-12 NOTE — ED Provider Notes (Signed)
 [place] EMERGENCY DEPARTMENT AT [facility] Provider Note   CSN: [id number] Arrival date & time: 01/12/24  1147     History  Chief Complaint  Patient presents with   Headache    Bonnie Fleming is a 53 y.o. female.  Patient fell and hit her head and her upper back.  No loss of consciousness.  She was seen by primary care doctor and given Naprosyn  but this does not seem to help with the pain  The history is provided by the patient, medical records and a relative. No language interpreter was used.  Headache Pain location:  Occipital Quality:  Dull Radiates to:  Does not radiate Severity currently:  3/10 Severity at highest:  8/10 Onset quality:  Sudden Timing:  Intermittent Progression:  Waxing and waning Chronicity:  New Similar to prior headaches: no   Context: not activity   Relieved by:  Nothing Associated symptoms: no abdominal pain, no back pain, no congestion, no cough, no diarrhea, no fatigue, no seizures and no sinus pressure        Home Medications Prior to Admission medications   Medication Sig Start Date End Date Taking? Authorizing Provider  HYDROcodone -acetaminophen  (NORCO/VICODIN) 5-325 MG tablet Take 1 every 6 hours for pain has not relieved by your other medicines 01/12/24  Yes Suzette Pac, MD  chlorhexidine  (HIBICLENS ) 4 % external liquid Apply topically daily as needed. Apply 3 to 4 drops of the solution into warm water , cleanse the area with the solution and water  twice daily until symptoms improve. 10/07/23   Leath-Warren, Etta PARAS, NP  mupirocin  ointment (BACTROBAN ) 2 % Apply 1 Application topically 2 (two) times daily. 10/07/23   Leath-Warren, Etta PARAS, NP  naproxen  (NAPROSYN ) 500 MG tablet Take 1 tablet (500 mg total) by mouth 2 (two) times daily as needed. 01/07/24   Stuart Vernell Norris, PA-C  pantoprazole  (PROTONIX ) 40 MG tablet Take 1 tablet (40 mg total) by mouth daily. 01/31/23   Carlan, Chelsea L, NP  tiZANidine   (ZANAFLEX ) 4 MG capsule Take 1 capsule (4 mg total) by mouth 3 (three) times daily as needed for muscle spasms. Do not drink alcohol or drive while taking this medication 01/07/24   Stuart Vernell Norris, PA-C  ursodiol  (ACTIGALL ) 300 MG capsule Take 1 capsule (300 mg total) by mouth 2 (two) times daily. 08/20/23   Carlan, Chelsea L, NP      Allergies    Patient has no known allergies.    Review of Systems   Review of Systems  Constitutional:  Negative for appetite change and fatigue.  HENT:  Negative for congestion, ear discharge and sinus pressure.   Eyes:  Negative for discharge.  Respiratory:  Negative for cough.   Cardiovascular:  Negative for chest pain.  Gastrointestinal:  Negative for abdominal pain and diarrhea.  Genitourinary:  Negative for frequency and hematuria.  Musculoskeletal:  Negative for back pain.  Skin:  Negative for rash.  Neurological:  Positive for headaches. Negative for seizures.  Psychiatric/Behavioral:  Negative for hallucinations.     Physical Exam Updated Vital Signs BP 126/86 (BP Location: Right Arm)   Pulse 92   Temp 99.7 F (37.6 C) (Oral)   Resp 18   Ht 5' 2 (1.575 m)   Wt 65.8 kg   SpO2 100%   BMI 26.52 kg/m  Physical Exam Vitals and nursing note reviewed.  Constitutional:      Appearance: She is well-developed.  HENT:     Head: Normocephalic.  Comments: Tender occipital area    Nose: Nose normal.  Eyes:     General: No scleral icterus.    Conjunctiva/sclera: Conjunctivae normal.  Neck:     Thyroid: No thyromegaly.  Cardiovascular:     Rate and Rhythm: Normal rate and regular rhythm.     Heart sounds: No murmur heard.    No friction rub. No gallop.  Pulmonary:     Breath sounds: No stridor. No wheezing or rales.  Chest:     Chest wall: No tenderness.  Abdominal:     General: There is no distension.     Tenderness: There is no abdominal tenderness. There is no rebound.  Musculoskeletal:        General: Normal range of  motion.     Cervical back: Neck supple.     Comments: Tender upper back  Lymphadenopathy:     Cervical: No cervical adenopathy.  Skin:    Findings: No erythema or rash.  Neurological:     Mental Status: She is alert and oriented to person, place, and time.     Motor: No abnormal muscle tone.     Coordination: Coordination normal.  Psychiatric:        Behavior: Behavior normal.     ED Results / Procedures / Treatments   Labs (all labs ordered are listed, but only abnormal results are displayed) Labs Reviewed - No data to display  EKG None  Radiology DG Chest 2 View Result Date: 01/12/2024 CLINICAL DATA:  Pain EXAM: CHEST - 2 VIEW COMPARISON:  None Available. FINDINGS: The heart size and mediastinal contours are within normal limits. Streaky bibasilar opacities. No pleural effusion or pneumothorax. Mild dextrocurvature of the thoracic spine. No acute bony findings. IMPRESSION: Streaky bibasilar opacities, favor atelectasis. Electronically Signed   By: Mabel Converse D.O.   On: 01/12/2024 13:15   CT Head Wo Contrast Result Date: 01/12/2024 CLINICAL DATA:  Head trauma, abnormal mental status (Age 17-64y); Neck trauma, focal neuro deficit or paresthesia (Age 33-64y) EXAM: CT HEAD WITHOUT CONTRAST CT CERVICAL SPINE WITHOUT CONTRAST TECHNIQUE: Multidetector CT imaging of the head and cervical spine was performed following the standard protocol without intravenous contrast. Multiplanar CT image reconstructions of the cervical spine were also generated. RADIATION DOSE REDUCTION: This exam was performed according to the departmental dose-optimization program which includes automated exposure control, adjustment of the mA and/or kV according to patient size and/or use of iterative reconstruction technique. COMPARISON:  None Available. FINDINGS: CT HEAD FINDINGS Brain: No evidence of acute infarction, hemorrhage, hydrocephalus, extra-axial collection or mass lesion/mass effect. Vascular: No  hyperdense vessel. Skull: No acute fracture. Sinuses/Orbits: Clear sinuses.  No acute orbital findings. Other: No mastoid effusions. CT CERVICAL SPINE FINDINGS Alignment: No substantial sagittal subluxation. Skull base and vertebrae: No acute fracture. Soft tissues and spinal canal: No prevertebral fluid or swelling. No visible canal hematoma. Disc levels:  No significant bony degenerative change. Upper chest: Visualized lung apices are clear. IMPRESSION: No evidence of acute abnormality intracranially or in the cervical spine. Electronically Signed   By: Gilmore GORMAN Molt M.D.   On: 01/12/2024 13:00   CT Cervical Spine Wo Contrast Result Date: 01/12/2024 CLINICAL DATA:  Head trauma, abnormal mental status (Age 48-64y); Neck trauma, focal neuro deficit or paresthesia (Age 30-64y) EXAM: CT HEAD WITHOUT CONTRAST CT CERVICAL SPINE WITHOUT CONTRAST TECHNIQUE: Multidetector CT imaging of the head and cervical spine was performed following the standard protocol without intravenous contrast. Multiplanar CT image reconstructions of the cervical spine were  also generated. RADIATION DOSE REDUCTION: This exam was performed according to the departmental dose-optimization program which includes automated exposure control, adjustment of the mA and/or kV according to patient size and/or use of iterative reconstruction technique. COMPARISON:  None Available. FINDINGS: CT HEAD FINDINGS Brain: No evidence of acute infarction, hemorrhage, hydrocephalus, extra-axial collection or mass lesion/mass effect. Vascular: No hyperdense vessel. Skull: No acute fracture. Sinuses/Orbits: Clear sinuses.  No acute orbital findings. Other: No mastoid effusions. CT CERVICAL SPINE FINDINGS Alignment: No substantial sagittal subluxation. Skull base and vertebrae: No acute fracture. Soft tissues and spinal canal: No prevertebral fluid or swelling. No visible canal hematoma. Disc levels:  No significant bony degenerative change. Upper chest:  Visualized lung apices are clear. IMPRESSION: No evidence of acute abnormality intracranially or in the cervical spine. Electronically Signed   By: Gilmore GORMAN Molt M.D.   On: 01/12/2024 13:00    Procedures Procedures    Medications Ordered in ED Medications  HYDROcodone -acetaminophen  (NORCO/VICODIN) 5-325 MG per tablet 1 tablet (has no administration in time range)    ED Course/ Medical Decision Making/ A&P                                 Medical Decision Making Amount and/or Complexity of Data Reviewed Radiology: ordered.  Risk Prescription drug management.   Fall with contusion to head and back.  Possible minor concussion.  Patient has been taken Naprosyn  without help.  She will be given some Vicodin and follow-up as needed       Final Clinical Impression(s) / ED Diagnoses Final diagnoses:  Fall, initial encounter    Rx / DC Orders ED Discharge Orders          Ordered    HYDROcodone -acetaminophen  (NORCO/VICODIN) 5-325 MG tablet        01/12/24 1401              Suzette Pac, MD 01/13/24 1533

## 2024-02-01 ENCOUNTER — Other Ambulatory Visit (INDEPENDENT_AMBULATORY_CARE_PROVIDER_SITE_OTHER): Payer: Self-pay | Admitting: Gastroenterology

## 2024-02-24 ENCOUNTER — Ambulatory Visit (INDEPENDENT_AMBULATORY_CARE_PROVIDER_SITE_OTHER): Payer: PRIVATE HEALTH INSURANCE | Admitting: Gastroenterology

## 2024-02-27 ENCOUNTER — Ambulatory Visit (INDEPENDENT_AMBULATORY_CARE_PROVIDER_SITE_OTHER): Payer: No Typology Code available for payment source | Admitting: Gastroenterology

## 2024-04-27 ENCOUNTER — Ambulatory Visit (INDEPENDENT_AMBULATORY_CARE_PROVIDER_SITE_OTHER): Admitting: Gastroenterology

## 2024-04-27 ENCOUNTER — Encounter (INDEPENDENT_AMBULATORY_CARE_PROVIDER_SITE_OTHER): Payer: Self-pay | Admitting: Gastroenterology

## 2024-04-27 VITALS — BP 124/78 | HR 88 | Temp 97.8°F | Ht 62.0 in | Wt 147.6 lb

## 2024-04-27 DIAGNOSIS — K8050 Calculus of bile duct without cholangitis or cholecystitis without obstruction: Secondary | ICD-10-CM | POA: Diagnosis not present

## 2024-04-27 DIAGNOSIS — R748 Abnormal levels of other serum enzymes: Secondary | ICD-10-CM

## 2024-04-27 DIAGNOSIS — R140 Abdominal distension (gaseous): Secondary | ICD-10-CM | POA: Diagnosis not present

## 2024-04-27 DIAGNOSIS — K219 Gastro-esophageal reflux disease without esophagitis: Secondary | ICD-10-CM | POA: Diagnosis not present

## 2024-04-27 DIAGNOSIS — Z1211 Encounter for screening for malignant neoplasm of colon: Secondary | ICD-10-CM

## 2024-04-27 NOTE — Progress Notes (Signed)
 Samantha Cress, M.D. Gastroenterology & Hepatology [facility] [address] [place], [place] [address]  Primary Care Physician: Bolivar Bushman., PA-C [address] [place] [address]  I will communicate my assessment and recommendations to the referring MD via EMR.  Problems: History of H. pylori Chronically elevated alkaline phosphatase History of choledocholithiasis Flatulence and bloating  History of Present Illness: Bonnie Fleming is a  53 y.o. female with past medical history of H pylori gastritis and choledocolithiasis, who presents for follow up of choledocholithiasis, flatulence and bloating.  The patient was last seen on 08/20/2023. At that time, the patient was continued on ursodiol  300 mg BID.  She was continued on Protonix  40 mg every day.  She also had CMP checked which showed an alkaline phosphatase of 179, ALT 33, AST 27, rest with the lower limits.  Had a ultrasound ordered but this was never performed.The patient denies having any nausea, vomiting, fever, chills, hematochezia, melena, hematemesis, abdominal distention, abdominal pain, diarrhea, jaundice, pruritus or weight loss.   Patient reports for the last couple of weeks, she has noticed that she has presented more flatulence. She has also presented some bloating in the abdomen. No diarrhea. Has not changed her diet recently. No sick contacts or recent travel.  She has presented some heartburn when she drinks coffee. Takes Protonix  daily.  She stopped taking ursodiol  as she did not want to take medications.  Last UJW:JXBJY  Last Endoscopy:05/16/22 - Normal esophagus. - Normal stomach. - Normal examined duodenum. - Normal esophagus. - Z-line regular, 39 cm from the incisors. - Normal cardia, gastric fundus and gastric body. - Gastritis. Biopsied.(H pylori, treated with pylera) - Normal duodenal bulb, second portion of the duodenum and major  papilla.  Past Medical History:History reviewed. No pertinent past medical history.  Past Surgical History: Past Surgical History:  Procedure Laterality Date   BILIARY STENT PLACEMENT N/A 05/16/2022   Procedure: BILIARY STENT PLACEMENT;  Surgeon: Ruby Corporal, MD;  Location: [facility];  Service: Gastroenterology;  Laterality: N/A;   CHOLECYSTECTOMY  2005   ERCP N/A 05/16/2022   Procedure: ENDOSCOPIC RETROGRADE CHOLANGIOPANCREATOGRAPHY (ERCP);  Surgeon: Ruby Corporal, MD;  Location: [facility];  Service: Gastroenterology;  Laterality: N/A;   ESOPHAGOGASTRODUODENOSCOPY N/A 05/16/2022   Procedure: ESOPHAGOGASTRODUODENOSCOPY (EGD);  Surgeon: Ruby Corporal, MD;  Location: [facility];  Service: Gastroenterology;  Laterality: N/A;   LAPAROSCOPIC UNILATERAL SALPINGO OOPHERECTOMY Right 05/18/2020   Procedure: LAPAROSCOPIC RIGHT SALPINGO OOPHORECTOMY;  Surgeon: Wendelyn Halter, MD;  Location: [facility];  Service: Gynecology;  Laterality: Right;   LITHOTRIPSY N/A 05/16/2022   Procedure: MECHANICAL LITHOTRIPSY;  Surgeon: Ruby Corporal, MD;  Location: [facility];  Service: Gastroenterology;  Laterality: N/A;   REMOVAL OF STONES N/A 05/16/2022   Procedure: REMOVAL OF STONES;  Surgeon: Ruby Corporal, MD;  Location: [facility];  Service: Gastroenterology;  Laterality: N/A;   SPHINCTEROTOMY N/A 05/16/2022   Procedure: SPHINCTEROTOMY DILATED TO ;  Surgeon: Ruby Corporal, MD;  Location: [facility];  Service: Gastroenterology;  Laterality: N/A;    Family History: Family History  Problem Relation Age of Onset   Diabetes Father    Cancer - Colon Neg Hx    Liver disease Neg Hx     Social History: Social History   Tobacco Use  Smoking Status Never   Passive exposure: Never  Smokeless Tobacco Never   Social History   Substance and Sexual Activity  Alcohol Use No  Social History   Substance and Sexual Activity  Drug Use No    Allergies: No Known Allergies  Medications: Current Outpatient  Medications  Medication Sig Dispense Refill   ursodiol  (ACTIGALL ) 300 MG capsule Take 1 capsule (300 mg total) by mouth 2 (two) times daily. (Patient not taking: Reported on 04/27/2024) 180 capsule 2   No current facility-administered medications for this visit.    Review of Systems: GENERAL: negative for malaise, night sweats HEENT: No changes in hearing or vision, no nose bleeds or other nasal problems. NECK: Negative for lumps, goiter, pain and significant neck swelling RESPIRATORY: Negative for cough, wheezing CARDIOVASCULAR: Negative for chest pain, leg swelling, palpitations, orthopnea GI: SEE HPI MUSCULOSKELETAL: Negative for joint pain or swelling, back pain, and muscle pain. SKIN: Negative for lesions, rash PSYCH: Negative for sleep disturbance, mood disorder and recent psychosocial stressors. HEMATOLOGY Negative for prolonged bleeding, bruising easily, and swollen nodes. ENDOCRINE: Negative for cold or heat intolerance, polyuria, polydipsia and goiter. NEURO: negative for tremor, gait imbalance, syncope and seizures. The remainder of the review of systems is noncontributory.   Physical Exam: BP 124/78 (BP Location: Left Arm, Patient Position: Sitting, Cuff Size: Normal)   Pulse 88   Temp 97.8 F (36.6 C) (Temporal)   Ht 5\' 2"  (1.575 m)   Wt 147 lb 9.6 oz (67 kg)   BMI 27.00 kg/m  GENERAL: The patient is AO x3, in no acute distress. HEENT: Head is normocephalic and atraumatic. EOMI are intact. Mouth is well hydrated and without lesions. NECK: Supple. No masses LUNGS: Clear to auscultation. No presence of rhonchi/wheezing/rales. Adequate chest expansion HEART: RRR, normal s1 and s2. ABDOMEN: Soft, nontender, no guarding, no peritoneal signs, and nondistended. BS +. No masses. EXTREMITIES: Without any cyanosis, clubbing, rash, lesions or edema. NEUROLOGIC: AOx3, no focal motor deficit. SKIN: no jaundice, no rashes  Imaging/Labs: as above  I personally reviewed and  interpreted the available labs, imaging and endoscopic files.  Impression and Plan: Bonnie Fleming is a  53 y.o. female with past medical history of H pylori gastritis and choledocolithiasis, who presents for follow up of choledocholithiasis, flatulence and bloating.  The patient has presented new onset of flatulence and abdominal bloating without other associated symptoms.  No red flag signs or any other associated symptoms.  It is possible her symptoms are related to IBS, for which she will benefit from implementing a low FODMAP diet and taking IBgard and/or Gas-X as needed.  Regarding her elevated alkaline phosphatase and history of choledocholithiasis, I emphasized the importance of taking ursodiol  again as this may prevent recurrent secondary biliary stones that have required previous ERCPs.  Finally, patient has presented adequate control of her GERD while taking pantoprazole  at moderate dose.  She will continue this for now but she can take an extra dose of Pepcid as needed if she presents recurrent breakthrough episodes of heartburn.  -Check CBC and CMP in 6 months -Restart ursodiol  300 mg twice a day - Explained presumed etiology of IBS symptoms. Patient was counseled about the benefit of implementing a low FODMAP to improve symptoms and recurrent episodes. A dietary list was provided to the patient. Also, the patient was counseled about the benefit of avoiding stressing situations and potential environmental triggers leading to symptomatology. - Start IBGard 1 tablet every 8-12 hours as needed. Can use peppermint tea as well. -It is still presenting persistent bloating, take Gas-X as needed. -Schedule colonoscopy Continue pantoprazole  40 mg qday.  Can take Pepcid as needed  for breakthrough episodes of heartburn.  All questions were answered.      Samantha Cress, MD Gastroenterology and Hepatology [facility]

## 2024-04-27 NOTE — Patient Instructions (Addendum)
 Realice los examenes de sangre en 6 meses. Empiece a tomar de nuevo ursodiol  300 mg dos veces al dia Implemente la diet tipo FODMAP. Si tiene distension abdominal y gases, tome IBGard/menta comestible a necesidad. Si no mejora, tome Gas-X cada 8 horas a necesidad. Agendar colonoscopia Continue pantoprazole  40 mg cada dia. Si tiene acidez o reflujo, tome Pepcid 20 mg OTC a necesidad

## 2024-05-07 ENCOUNTER — Telehealth (INDEPENDENT_AMBULATORY_CARE_PROVIDER_SITE_OTHER): Payer: Self-pay | Admitting: Gastroenterology

## 2024-05-07 NOTE — Telephone Encounter (Signed)
Left message to return call to schedule TCS

## 2024-05-14 MED ORDER — PEG 3350-KCL-NA BICARB-NACL 420 G PO SOLR: 4000.0000 mL | Freq: Once | ORAL | 0 refills | Status: AC

## 2024-05-14 NOTE — Telephone Encounter (Signed)
 Contacted pt daughter per DPR note but unable to reach daughter. Contacted pt. Pt scheduled. Prep sent to pharmacy. Instructions mailed to pt.

## 2024-05-14 NOTE — Addendum Note (Signed)
 Addended by: Bastien Strawser on: 05/14/2024 09:36 AM   Modules accepted: Orders

## 2024-06-01 ENCOUNTER — Encounter: Payer: Self-pay | Admitting: *Deleted

## 2024-06-01 NOTE — Telephone Encounter (Signed)
 Pt's daughter called and needed to reschedule pt's procedure due to transportation issues. Pt has been rescheduled for 06/12/24. Updated instructions printed for pt to pick up

## 2024-06-12 ENCOUNTER — Ambulatory Visit (HOSPITAL_COMMUNITY)

## 2024-06-12 ENCOUNTER — Ambulatory Visit (HOSPITAL_COMMUNITY)
Admission: RE | Admit: 2024-06-12 | Discharge: 2024-06-12 | Disposition: A | Discharge status: Home / Self Care | Source: Ambulatory Visit | Attending: Gastroenterology | Admitting: Gastroenterology

## 2024-06-12 ENCOUNTER — Encounter (HOSPITAL_COMMUNITY)
Admission: RE | Disposition: A | Discharge status: Home / Self Care | Payer: Self-pay | Source: Ambulatory Visit | Attending: Gastroenterology

## 2024-06-12 DIAGNOSIS — Z539 Procedure and treatment not carried out, unspecified reason: Secondary | ICD-10-CM | POA: Insufficient documentation

## 2024-06-12 DIAGNOSIS — K219 Gastro-esophageal reflux disease without esophagitis: Secondary | ICD-10-CM | POA: Insufficient documentation

## 2024-06-12 DIAGNOSIS — Z79899 Other long term (current) drug therapy: Secondary | ICD-10-CM | POA: Insufficient documentation

## 2024-06-12 DIAGNOSIS — Z1211 Encounter for screening for malignant neoplasm of colon: Secondary | ICD-10-CM | POA: Insufficient documentation

## 2024-06-12 SURGERY — CANCELLED PROCEDURE
Anesthesia: General

## 2024-06-12 NOTE — Anesthesia Preprocedure Evaluation (Addendum)
 Anesthesia Evaluation  Patient identified by MRN, date of birth, ID band Patient awake    Reviewed: Allergy & Precautions, NPO status , Patient's Chart, lab work & pertinent test results  Airway Mallampati: III  TM Distance: >3 FB Neck ROM: Full    Dental   Pulmonary neg pulmonary ROS          Cardiovascular negative cardio ROS      Neuro/Psych negative neurological ROS  negative psych ROS   GI/Hepatic Neg liver ROS,GERD  Medicated,,  Endo/Other  negative endocrine ROS    Renal/GU negative Renal ROS  negative genitourinary   Musculoskeletal negative musculoskeletal ROS (+)    Abdominal   Peds negative pediatric ROS (+)  Hematology negative hematology ROS (+)   Anesthesia Other Findings   Reproductive/Obstetrics negative OB ROS                             Anesthesia Physical Anesthesia Plan  ASA: 2  Anesthesia Plan: General   Post-op Pain Management:    Induction: Intravenous  PONV Risk Score and Plan: Propofol  infusion  Airway Management Planned: Nasal Cannula and Natural Airway  Additional Equipment: None  Intra-op Plan:   Post-operative Plan:   Informed Consent: I have reviewed the patients History and Physical, chart, labs and discussed the procedure including the risks, benefits and alternatives for the proposed anesthesia with the patient or authorized representative who has indicated his/her understanding and acceptance.     Dental advisory given  Plan Discussed with: CRNA  Anesthesia Plan Comments:         Anesthesia Quick Evaluation

## 2024-06-12 NOTE — Progress Notes (Signed)
 Patient arrived for procedure today and had not completed a prep for her colonoscopy due to some confusion with instructions. Dr. Sammi Crick talked with patient and told her that we would let the office know so we could get her rescheduled. Office was notified.

## 2024-10-14 ENCOUNTER — Encounter (INDEPENDENT_AMBULATORY_CARE_PROVIDER_SITE_OTHER): Payer: Self-pay | Admitting: Gastroenterology
